# Patient Record
Sex: Female | Born: 1970 | Race: White | Hispanic: No | Marital: Married | Smoking: Never smoker
Health system: Southern US, Community
[De-identification: ages and names within clinical notes are randomized; demographics above are authoritative.]

## PROBLEM LIST (undated history)

## (undated) DIAGNOSIS — E7800 Pure hypercholesterolemia, unspecified: Secondary | ICD-10-CM

## (undated) DIAGNOSIS — I8390 Asymptomatic varicose veins of unspecified lower extremity: Secondary | ICD-10-CM

## (undated) DIAGNOSIS — K5900 Constipation, unspecified: Secondary | ICD-10-CM

## (undated) DIAGNOSIS — M549 Dorsalgia, unspecified: Secondary | ICD-10-CM

## (undated) DIAGNOSIS — M7989 Other specified soft tissue disorders: Secondary | ICD-10-CM

## (undated) DIAGNOSIS — E538 Deficiency of other specified B group vitamins: Secondary | ICD-10-CM

## (undated) DIAGNOSIS — M2550 Pain in unspecified joint: Secondary | ICD-10-CM

## (undated) DIAGNOSIS — F419 Anxiety disorder, unspecified: Secondary | ICD-10-CM

## (undated) DIAGNOSIS — E559 Vitamin D deficiency, unspecified: Secondary | ICD-10-CM

## (undated) HISTORY — DX: Other specified soft tissue disorders: M79.89

## (undated) HISTORY — DX: Pain in unspecified joint: M25.50

## (undated) HISTORY — DX: Pure hypercholesterolemia, unspecified: E78.00

## (undated) HISTORY — DX: Constipation, unspecified: K59.00

## (undated) HISTORY — DX: Asymptomatic varicose veins of unspecified lower extremity: I83.90

## (undated) HISTORY — DX: Vitamin D deficiency, unspecified: E55.9

## (undated) HISTORY — PX: CYSTOSCOPY: SUR368

## (undated) HISTORY — PX: ABLATION: SHX5711

## (undated) HISTORY — DX: Deficiency of other specified B group vitamins: E53.8

## (undated) HISTORY — PX: OTHER SURGICAL HISTORY: SHX169

## (undated) HISTORY — DX: Dorsalgia, unspecified: M54.9

## (undated) HISTORY — DX: Anxiety disorder, unspecified: F41.9

---

## 1998-01-28 ENCOUNTER — Inpatient Hospital Stay (HOSPITAL_COMMUNITY): Admission: AD | Admit: 1998-01-28 | Discharge: 1998-01-30 | Payer: Self-pay | Admitting: Obstetrics & Gynecology

## 1999-04-03 ENCOUNTER — Other Ambulatory Visit: Admission: RE | Admit: 1999-04-03 | Discharge: 1999-04-03 | Payer: Self-pay | Admitting: Obstetrics and Gynecology

## 1999-10-09 ENCOUNTER — Inpatient Hospital Stay (HOSPITAL_COMMUNITY): Admission: AD | Admit: 1999-10-09 | Discharge: 1999-10-11 | Payer: Self-pay | Admitting: Obstetrics and Gynecology

## 1999-11-09 ENCOUNTER — Other Ambulatory Visit: Admission: RE | Admit: 1999-11-09 | Discharge: 1999-11-09 | Payer: Self-pay | Admitting: Obstetrics and Gynecology

## 2001-02-13 ENCOUNTER — Observation Stay (HOSPITAL_COMMUNITY): Admission: AD | Admit: 2001-02-13 | Discharge: 2001-02-14 | Payer: Self-pay | Admitting: Obstetrics and Gynecology

## 2001-02-13 ENCOUNTER — Encounter: Payer: Self-pay | Admitting: Obstetrics and Gynecology

## 2001-12-22 ENCOUNTER — Other Ambulatory Visit: Admission: RE | Admit: 2001-12-22 | Discharge: 2001-12-22 | Payer: Self-pay | Admitting: Obstetrics and Gynecology

## 2003-01-21 ENCOUNTER — Other Ambulatory Visit: Admission: RE | Admit: 2003-01-21 | Discharge: 2003-01-21 | Payer: Self-pay | Admitting: Obstetrics and Gynecology

## 2003-08-29 ENCOUNTER — Other Ambulatory Visit: Admission: RE | Admit: 2003-08-29 | Discharge: 2003-08-29 | Payer: Self-pay | Admitting: Obstetrics and Gynecology

## 2004-03-09 ENCOUNTER — Inpatient Hospital Stay (HOSPITAL_COMMUNITY): Admission: AD | Admit: 2004-03-09 | Discharge: 2004-03-12 | Payer: Self-pay | Admitting: Obstetrics and Gynecology

## 2004-03-14 ENCOUNTER — Ambulatory Visit (HOSPITAL_COMMUNITY): Admission: RE | Admit: 2004-03-14 | Discharge: 2004-03-14 | Payer: Self-pay | Admitting: Obstetrics and Gynecology

## 2004-04-18 ENCOUNTER — Other Ambulatory Visit: Admission: RE | Admit: 2004-04-18 | Discharge: 2004-04-18 | Payer: Self-pay | Admitting: Obstetrics and Gynecology

## 2005-09-10 ENCOUNTER — Other Ambulatory Visit: Admission: RE | Admit: 2005-09-10 | Discharge: 2005-09-10 | Payer: Self-pay | Admitting: Obstetrics and Gynecology

## 2006-03-20 ENCOUNTER — Inpatient Hospital Stay (HOSPITAL_COMMUNITY): Admission: AD | Admit: 2006-03-20 | Discharge: 2006-03-22 | Payer: Self-pay | Admitting: Obstetrics and Gynecology

## 2007-09-03 ENCOUNTER — Inpatient Hospital Stay (HOSPITAL_COMMUNITY): Admission: AD | Admit: 2007-09-03 | Discharge: 2007-09-03 | Payer: Self-pay | Admitting: Obstetrics and Gynecology

## 2008-11-26 ENCOUNTER — Emergency Department (HOSPITAL_COMMUNITY): Admission: EM | Admit: 2008-11-26 | Discharge: 2008-11-26 | Payer: Self-pay | Admitting: Emergency Medicine

## 2009-07-10 ENCOUNTER — Encounter: Admission: RE | Admit: 2009-07-10 | Discharge: 2009-07-10 | Payer: Self-pay | Admitting: Obstetrics and Gynecology

## 2010-01-01 ENCOUNTER — Emergency Department (HOSPITAL_COMMUNITY): Admission: EM | Admit: 2010-01-01 | Discharge: 2010-01-01 | Payer: Self-pay | Admitting: Emergency Medicine

## 2010-10-15 ENCOUNTER — Ambulatory Visit (HOSPITAL_COMMUNITY): Admission: RE | Admit: 2010-10-15 | Discharge: 2010-10-15 | Payer: Self-pay | Admitting: Obstetrics and Gynecology

## 2010-12-24 ENCOUNTER — Encounter: Payer: Self-pay | Admitting: Obstetrics and Gynecology

## 2011-02-12 LAB — CBC
HCT: 34.5 % — ABNORMAL LOW (ref 36.0–46.0)
HCT: 39.3 % (ref 36.0–46.0)
Hemoglobin: 11.8 g/dL — ABNORMAL LOW (ref 12.0–15.0)
Hemoglobin: 13.3 g/dL (ref 12.0–15.0)
MCH: 30.9 pg (ref 26.0–34.0)
MCH: 31.1 pg (ref 26.0–34.0)
MCHC: 33.7 g/dL (ref 30.0–36.0)
MCHC: 34 g/dL (ref 30.0–36.0)
MCV: 91.4 fL (ref 78.0–100.0)
MCV: 91.7 fL (ref 78.0–100.0)
Platelets: 221 10*3/uL (ref 150–400)
Platelets: 260 10*3/uL (ref 150–400)
RBC: 3.78 MIL/uL — ABNORMAL LOW (ref 3.87–5.11)
RBC: 4.29 MIL/uL (ref 3.87–5.11)
RDW: 12.2 % (ref 11.5–15.5)
RDW: 12.7 % (ref 11.5–15.5)
WBC: 7.5 10*3/uL (ref 4.0–10.5)
WBC: 8.5 10*3/uL (ref 4.0–10.5)

## 2011-02-18 LAB — PREGNANCY, URINE: Preg Test, Ur: NEGATIVE

## 2011-02-18 LAB — GC/CHLAMYDIA PROBE AMP, GENITAL
Chlamydia, DNA Probe: NEGATIVE
GC Probe Amp, Genital: NEGATIVE

## 2011-02-18 LAB — URINALYSIS, ROUTINE W REFLEX MICROSCOPIC
Ketones, ur: NEGATIVE mg/dL
Nitrite: NEGATIVE
Specific Gravity, Urine: 1.015 (ref 1.005–1.030)
pH: 5.5 (ref 5.0–8.0)

## 2011-02-18 LAB — WET PREP, GENITAL

## 2011-04-19 NOTE — H&P (Signed)
[facility]  Patient:    ALYSSANDRA, HULSEBUS                        MRN: [id number] Adm. Date:  02/13/01 Attending:  Trevor Iha, M.D.                         History and Physical  HISTORY OF PRESENT ILLNESS:   Ms. Sebastiano is a 40 year old G5, P4, at approximately 5-6 weeks estimated gestational age by somewhat irregular last menstrual period of January 06, 2001.  She began with abdominal pain this morning, went to her internist, who called and stated she needs to be evaluated for ectopic pregnancy.  She presented to the emergency room in obvious distress with rebound and guarding.  Hemoglobin was 11.6.  Beta hCG 3352 and her ultrasound consistent with a ruptured ectopic pregnancy.  PAST MEDICAL HISTORY:         Negative.  PAST SURGICAL HISTORY:        Negative.  PAST OB HISTORY:              Four vaginal deliveries.  GYN HISTORY:                  No history of endometriosis or GYN surgeries.  PHYSICAL EXAMINATION:  VITAL SIGNS:                  Blood pressure is 126/51, pulse is 89, respirations 18.  HEART:                        Regular rate and rhythm.  LUNGS:                        Clear to auscultation bilaterally.  ABDOMEN:                      Mildly distended, diffuse abdominal tenderness to palpation, rebound.  Ultrasound shows hemoperitoneum and no intrauterine pregnancy.  IMPRESSION/PLAN:              Probable ectopic and hemoperitoneum.  Plan laparoscopy for evaluation and treatment of the ectopic pregnancy.  Risks and benefits were discussed at length including, but not limited to, risk of infection, bleeding, and damage to _________ of bowel or bladder, possibility of removing the right tube or ovary.  Also possibility of laparotomy, also risks associated with blood transfusion.  Patient does desire to preserve fertility. DD:  02/13/01 TD:  02/13/01 Job: [id number]

## 2011-09-12 LAB — CBC
HCT: 37.7
Hemoglobin: 12.8
RDW: 14.3 — ABNORMAL HIGH

## 2011-09-19 ENCOUNTER — Other Ambulatory Visit: Payer: Self-pay

## 2011-09-19 DIAGNOSIS — I83893 Varicose veins of bilateral lower extremities with other complications: Secondary | ICD-10-CM

## 2011-11-08 ENCOUNTER — Encounter: Payer: Self-pay | Admitting: Vascular Surgery

## 2011-11-11 ENCOUNTER — Ambulatory Visit (INDEPENDENT_AMBULATORY_CARE_PROVIDER_SITE_OTHER): Payer: BC Managed Care – PPO | Admitting: Vascular Surgery

## 2011-11-11 ENCOUNTER — Encounter: Payer: Self-pay | Admitting: Vascular Surgery

## 2011-11-11 VITALS — BP 118/63 | HR 79 | Resp 20 | Ht 65.0 in | Wt 180.0 lb

## 2011-11-11 DIAGNOSIS — I8310 Varicose veins of unspecified lower extremity with inflammation: Secondary | ICD-10-CM | POA: Insufficient documentation

## 2011-11-11 DIAGNOSIS — I83893 Varicose veins of bilateral lower extremities with other complications: Secondary | ICD-10-CM

## 2011-11-11 NOTE — Progress Notes (Signed)
Subjective:     Patient ID: Ashlee Robertson, female   DOB: [birth year], 40 y.o.   MRN: [id number]  HPI this 40 year old healthy female developed varicose veins in the right leg in 1999 following a pregnancy. Since that time they have enlarged and become increasingly symptomatic. She describes aching throbbing burning and stinging discomfort in the right thigh and calf as the day progresses this worsens. She does not wear elastic compression stockings. Elevation of the leg does improve her symptoms. She has no history of DVT, thrombophlebitis, stasis ulcers, bleeding varicosities, or skin changes in the ankle. The pattern of varicosities has progressed over the past several years.  History reviewed. No pertinent past medical history.  History  Substance Use Topics  . Smoking status: Never Smoker   . Smokeless tobacco: Not on file  . Alcohol Use: No    History reviewed. No pertinent family history.  No Known Allergies  No current outpatient prescriptions on file.  BP 118/63  Pulse 79  Resp 20  Ht 5\' 5"  (1.651 m)  Wt 180 lb (81.647 kg)  BMI 29.95 kg/m2  Body mass index is 29.95 kg/(m^2).         Review of Systems she denies chest pain, dyspnea on exertion, PND, orthopnea, or claudication, and all other symptoms. Complete review of systems is negative     Objective:   Physical Exam blood pressure 118/63 heart rate 79 respirations 20 Generally she is well-developed well-nourished female in no apparent stress alert and oriented x3 HEENT normal for age Chest no rhonchi or wheezing Cardiovascular regular rhythm no murmurs carotid pulses 3+ no audible bruits Abdomen soft nontender with no masses Neurologic exam normal Musculoskeletal free major deformities Lower extremity exam reveals 3+ femoral and dorsalis pedis pulses palpable bilaterally. Right leg has venous insufficiency with bulging varicosities beginning in the proximal thigh extending up toward the inguinal area and and  laterally down around the knee into the lateral calf where there is a large pattern of reticular and spider veins. There is no hyperpigmentation or ulceration noted. No edema is noted today.      Assessment:     Venous insufficiency right leg with bulging varicosities in the right lateral thigh large area of reticular and spider veins which are increasingly symptomatic.    Plan:    #1 long-leg elastic compression stockings 20-30 mm gradient #2 elevate legs during the day as much as possible #3 ibuprofen on a daily basis #4 return in 3 months a venous duplex exam to see if she is a candidate for laser ablation and/or stab phlebectomy and sclerotherapy depending on outcome of ultrasound.

## 2011-11-13 ENCOUNTER — Encounter: Payer: Self-pay | Admitting: Vascular Surgery

## 2011-11-13 ENCOUNTER — Other Ambulatory Visit: Payer: Self-pay

## 2012-02-10 ENCOUNTER — Encounter: Payer: Self-pay | Admitting: Vascular Surgery

## 2012-02-11 ENCOUNTER — Encounter: Payer: Self-pay | Admitting: Vascular Surgery

## 2012-02-11 ENCOUNTER — Encounter (INDEPENDENT_AMBULATORY_CARE_PROVIDER_SITE_OTHER): Payer: BC Managed Care – PPO | Admitting: *Deleted

## 2012-02-11 ENCOUNTER — Ambulatory Visit (INDEPENDENT_AMBULATORY_CARE_PROVIDER_SITE_OTHER): Payer: BC Managed Care – PPO | Admitting: Vascular Surgery

## 2012-02-11 VITALS — BP 100/59 | HR 73 | Resp 18 | Ht 65.0 in | Wt 175.0 lb

## 2012-02-11 DIAGNOSIS — I83893 Varicose veins of bilateral lower extremities with other complications: Secondary | ICD-10-CM

## 2012-02-11 NOTE — Progress Notes (Signed)
Subjective:     Patient ID: Ashlee Robertson, female   DOB: 12-Mar-1971, 41 y.o.   MRN: [id number]  HPI this 41 year old female turned today for further followup regarding her severe venous insufficiency of the right leg. She continues to have aching throbbing and burning discomfort in the upper lateral right thigh extending down lateral to the knee into the calf this has not responded or improved with long leg elastic compression stockings 20-30 mm gradient as well as elevation and ibuprofen on a daily basis. She continues to have worsening symptoms which are affecting her daily living. She returns today for continued followup.  Past Medical History  Diagnosis Date  . Varicose veins     History  Substance Use Topics  . Smoking status: Never Smoker   . Smokeless tobacco: Never Used  . Alcohol Use: No    No family history on file.  No Known Allergies  No current outpatient prescriptions on file.  BP 100/59  Pulse 73  Resp 18  Ht 5\' 5"  (1.651 m)  Wt 175 lb (79.379 kg)  BMI 29.12 kg/m2  Body mass index is 29.12 kg/(m^2).            Review of Systems     Objective:   Physical Exam pressure 100/60 heart rate 73 respirations 18 Right lower stream he continues to have bulging varicosities beginning at the inguinal crease extending laterally in the thigh down around the knee and into the lateral calf area with reticular and spider veins as well as the bulging varicosities. There is no edema distally.  Today I ordered a venous duplex exam of the right leg which are reviewed and interpreted. The large strand of bulging varicosities communicate lateral to the saphenofemoral junction with a likely totally pain is entire area reveals reflux. The great saphenous vein itself is not have reflux. Reflux coming from this lateral branch communicates with all of the named varicosities above. There is no DVT.    Assessment:     Venous insufficiency right leg with symptomatic bulging  varicosities secondary to valvular incompetence. Symptoms are quite severe for this middle-aged female and there definitely affecting her daily living    Plan:      Believe the best plan would be greater than 20 stab phlebectomy of bulging varicosities right thigh and lateral calf area with 2 courses of sclerotherapy  We'll proceed with precertification to perform this in the near future to relieve her symptoms

## 2012-02-17 NOTE — Procedures (Unsigned)
LOWER EXTREMITY VENOUS REFLUX EXAM  INDICATION:  Painful varicosities of right lower extremity.  EXAM:  Using color-flow imaging and pulse Doppler spectral analysis, the right common femoral, femoral, popliteal, posterior tibial, great and small saphenous veins were evaluated.  There is no evidence suggesting deep venous insufficiency in the right lower extremity.  The right saphenofemoral junction is not competent with Reflux of >55milliseconds. The right great saphenous vein is competent.  The right proximal small saphenous vein demonstrates competency.  GSV Diameter (used if found to be incompetent only)                                           Right    Left Proximal Greater Saphenous Vein           cm       cm Proximal-to-mid-thigh                     cm       cm Mid thigh                                 cm       cm Mid-distal thigh                          cm       cm Distal thigh                              cm       cm Knee                                      cm       cm  IMPRESSION: 1. Right great saphenous is competent. 2. The right great saphenous vein does not appear to communicate with     the anterior thigh varicosities. 3. The deep venous system is competent. 4. The right small saphenous vein is competent,  ___________________________________________ Quita Skye. Hart Rochester, M.D.  LT/MEDQ  D:  02/11/2012  T:  02/11/2012  Job:  [id number]

## 2012-03-20 ENCOUNTER — Encounter: Payer: Self-pay | Admitting: Vascular Surgery

## 2012-03-23 ENCOUNTER — Ambulatory Visit (INDEPENDENT_AMBULATORY_CARE_PROVIDER_SITE_OTHER): Payer: BC Managed Care – PPO | Admitting: Vascular Surgery

## 2012-03-23 ENCOUNTER — Encounter: Payer: BC Managed Care – PPO | Admitting: Vascular Surgery

## 2012-03-23 ENCOUNTER — Encounter: Payer: Self-pay | Admitting: Vascular Surgery

## 2012-03-23 VITALS — BP 137/83 | HR 83 | Resp 16 | Ht 65.0 in | Wt 180.0 lb

## 2012-03-23 DIAGNOSIS — I83893 Varicose veins of bilateral lower extremities with other complications: Secondary | ICD-10-CM

## 2012-03-23 NOTE — Progress Notes (Signed)
Laser Ablation Procedure      Date: 03/23/2012    Ashlee Robertson DOB:06-08-1971  Consent signed: Yes  Surgeon:J.D. Hart Rochester  Procedure: Stab Phlebectomies right leg  BP 137/83  Pulse 83  Resp 16  Ht 5\' 5"  (1.651 m)  Wt 180 lb (81.647 kg)  BMI 29.95 kg/m2  Start time: 1:15   End time: 2:20  Tumescent Anesthesia: 200 cc 0.9% NaCl with 50 cc Lidocaine HCL with 1% Epi and 15 cc 8.4% NaHCO3  Local Anesthesia: 8 cc Lidocaine HCL and NaHCO3 (ratio 2:1)       Stab Phlebectomy: greater than 20 Sites: Thigh and Calf  Patient tolerated procedure well: [name]  Description of Procedure:    The patient was then put into Trendelenburg position.  Local anesthetic was utilized overlying the marked varicosities.  Greater than 20 stab wounds were made using the tip of an 11 blade; and using the vein hook,  The phlebectomies were performed using a hemostat to avulse these varicosities.  Adequate hemostasis was achieved, and steri strips were applied to the stab wound.     ABD pads and thigh high compression stockings were applied.  Ace wrap bandages were applied over the phlebectomy sites.  Blood loss was less than 15 cc.  The patient ambulated out of the operating room having tolerated the procedure well.

## 2012-03-23 NOTE — Progress Notes (Signed)
Subjective:     Patient ID: Ashlee Robertson, female   DOB: Dec 11, 1970, 41 y.o.   MRN: [id number]  HPI this 41 year old female had greater than 20 stab lobectomies of the right anterior and lateral thigh lateral calf area her painful varicosities. This was performed under local tumescent anesthesia. She tolerated the procedure well.   Review of Systems     Objective:   Physical ExamBP 137/83  Pulse 83  Resp 16  Ht 5\' 5"  (1.651 m)  Wt 180 lb (81.647 kg)  BMI 29.95 kg/m2     Assessment:     Well tolerated stab phlebectomy-greater than 20 secondary varicosities right anterolateral thigh and calf performed under local tumescent anesthesia    Plan:     Return in 6 weeks for 2 courses of sclerotherapy right leg

## 2012-03-24 ENCOUNTER — Encounter: Payer: Self-pay | Admitting: Vascular Surgery

## 2012-03-24 ENCOUNTER — Telehealth: Payer: Self-pay | Admitting: *Deleted

## 2012-03-24 NOTE — Telephone Encounter (Signed)
Reached patient at home. Doing well. No bleeding. Following all instructions. Reminded her of her fu appts.

## 2012-04-20 ENCOUNTER — Encounter: Payer: Self-pay | Admitting: *Deleted

## 2012-04-21 ENCOUNTER — Telehealth: Payer: Self-pay | Admitting: *Deleted

## 2012-04-21 ENCOUNTER — Ambulatory Visit (INDEPENDENT_AMBULATORY_CARE_PROVIDER_SITE_OTHER): Payer: BC Managed Care – PPO | Admitting: *Deleted

## 2012-04-21 ENCOUNTER — Encounter: Payer: Self-pay | Admitting: *Deleted

## 2012-04-21 DIAGNOSIS — I8310 Varicose veins of unspecified lower extremity with inflammation: Secondary | ICD-10-CM

## 2012-04-21 DIAGNOSIS — I781 Nevus, non-neoplastic: Secondary | ICD-10-CM | POA: Insufficient documentation

## 2012-04-21 NOTE — Telephone Encounter (Signed)
I had called the patient to check on her. This was her return phone call. She sounded good and said she was fine. I will let Dr. Hart Rochester know.

## 2012-04-21 NOTE — Progress Notes (Signed)
X=.3% Sotradecol administered with a 27g butterfly.  Patient received a total of 6cc. Treated all areas of concern. Easy access. Patient felt fine until she walked out to the lobby area. Said she felt a tightness when she took a deep breath and felt like she needed to cough. We observed her for 60 minutes. Vital signs: 137/87. HR: 87, resp: 20. Oxygen sat: 100%. Dr. Hart Rochester performed a neuro exam which was normal. Listened to her chest and heard no rales or ronchi. Pt. Coughed up clear phlem, no blood. Thirty minutes into the 60 she said she felt a little light headed. We continued to observe her and had her walk around a little in the room. Her husband arrived and will drive her home today. At the end of the hour, she stated she was better. We instructed her to take a Benadryl when she got home and if she worsened to call her medical doctor. If he was unable to see her, then she should go to the ERD. I asked that the patient call me tomorrow to let me know how she was doing. Patient and her husband were calm and appreciative for our care. Follow prn.  Photos: yes  Compression stockings applied: yes

## 2015-07-02 ENCOUNTER — Emergency Department (HOSPITAL_COMMUNITY): Payer: BLUE CROSS/BLUE SHIELD

## 2015-07-02 ENCOUNTER — Encounter (HOSPITAL_COMMUNITY): Payer: Self-pay

## 2015-07-02 ENCOUNTER — Emergency Department (HOSPITAL_COMMUNITY)
Admission: EM | Admit: 2015-07-02 | Discharge: 2015-07-02 | Disposition: A | Discharge status: Home / Self Care | Payer: BLUE CROSS/BLUE SHIELD | Attending: Emergency Medicine | Admitting: Emergency Medicine

## 2015-07-02 DIAGNOSIS — R079 Chest pain, unspecified: Secondary | ICD-10-CM | POA: Diagnosis not present

## 2015-07-02 DIAGNOSIS — Z8679 Personal history of other diseases of the circulatory system: Secondary | ICD-10-CM | POA: Insufficient documentation

## 2015-07-02 DIAGNOSIS — R42 Dizziness and giddiness: Secondary | ICD-10-CM | POA: Diagnosis not present

## 2015-07-02 DIAGNOSIS — R531 Weakness: Secondary | ICD-10-CM | POA: Diagnosis not present

## 2015-07-02 DIAGNOSIS — R519 Headache, unspecified: Secondary | ICD-10-CM

## 2015-07-02 DIAGNOSIS — M549 Dorsalgia, unspecified: Secondary | ICD-10-CM | POA: Insufficient documentation

## 2015-07-02 DIAGNOSIS — Z79899 Other long term (current) drug therapy: Secondary | ICD-10-CM | POA: Diagnosis not present

## 2015-07-02 DIAGNOSIS — R51 Headache: Secondary | ICD-10-CM | POA: Diagnosis not present

## 2015-07-02 LAB — I-STAT TROPONIN, ED
Troponin i, poc: 0 ng/mL (ref 0.00–0.08)
Troponin i, poc: 0 ng/mL (ref 0.00–0.08)

## 2015-07-02 LAB — CBC
HEMATOCRIT: 41.9 % (ref 36.0–46.0)
HEMOGLOBIN: 14.1 g/dL (ref 12.0–15.0)
MCH: 29.7 pg (ref 26.0–34.0)
MCHC: 33.7 g/dL (ref 30.0–36.0)
MCV: 88.2 fL (ref 78.0–100.0)
Platelets: 292 10*3/uL (ref 150–400)
RBC: 4.75 MIL/uL (ref 3.87–5.11)
RDW: 13.2 % (ref 11.5–15.5)
WBC: 9.7 10*3/uL (ref 4.0–10.5)

## 2015-07-02 LAB — BASIC METABOLIC PANEL
Anion gap: 11 (ref 5–15)
BUN: 11 mg/dL (ref 6–20)
CALCIUM: 9.8 mg/dL (ref 8.9–10.3)
CHLORIDE: 97 mmol/L — AB (ref 101–111)
CO2: 28 mmol/L (ref 22–32)
Creatinine, Ser: 0.88 mg/dL (ref 0.44–1.00)
GLUCOSE: 96 mg/dL (ref 65–99)
POTASSIUM: 3.4 mmol/L — AB (ref 3.5–5.1)
SODIUM: 136 mmol/L (ref 135–145)

## 2015-07-02 LAB — D-DIMER, QUANTITATIVE: D-Dimer, Quant: <0.27 ug/mL-FEU (ref 0.00–0.48)

## 2015-07-02 NOTE — ED Provider Notes (Signed)
CSN: [id number]   Arrival date & time 07/02/15 0024  History  This chart was scribed for  Mirian Mo, MD by Bethel Born, ED Scribe. This patient was seen in room B18C/B18C and the patient's care was started at 1:10 AM.  Chief Complaint  Patient presents with  . Chest Pain    HPI The history is provided by the patient. No language interpreter was used.   Ashlee Robertson is a 44 y.o. female who presents to the Emergency Department complaining of chest pain with sudden onset around 11:30 PM while walking into her house. The episode started with a sudden bilateral headache that radiated down the neck and shoulders. She had 2 episodes of the pain that lasted for a few seconds each. At that time she was light headed and had been in a warehouse for 2 hours. The chest pain has persisted and is described as tight and squeezing at present. She had heartburn last night but this pain is dissimilar. Associated symptoms include anxiety, numbness in the left side of the jaw, left sided back pain, and weakness in the LUE. Pt denies nausea, vomiting, diarrhea, and constipation. No history of anxiety or migraines. No recent increase in stress.   Past Medical History  Diagnosis Date  . Varicose veins     Past Surgical History  Procedure Laterality Date  . Laproscopy      ectopic preg  . Ablation      uterine    No family history on file.  History  Substance Use Topics  . Smoking status: Never Smoker   . Smokeless tobacco: Never Used  . Alcohol Use: No     Review of Systems  Constitutional: Negative for fever.  Respiratory: Negative for apnea and shortness of breath.   Cardiovascular: Positive for chest pain.  Gastrointestinal: Negative for nausea, vomiting and abdominal pain.  Musculoskeletal: Positive for back pain.  Neurological: Positive for weakness, light-headedness and headaches.  All other systems reviewed and are negative.    Home Medications   Prior to Admission  medications   Medication Sig Start Date End Date Taking? Authorizing Provider  Cholecalciferol (VITAMIN D-3 PO) Take 1 tablet by mouth daily.   Yes Historical Provider, MD  Cyanocobalamin (VITAMIN B-12 PO) Take 1 tablet by mouth daily.   Yes Historical Provider, MD    Allergies  Review of patient's allergies indicates no known allergies.  Triage Vitals: BP 138/63 mmHg  Pulse 82  Temp(Src) 97.7 F (36.5 C) (Oral)  Resp 20  Ht  (1.626 m)  Wt 190 lb (86.183 kg)  BMI 32.60 kg/m2  SpO2 99%  Physical Exam  Constitutional: She is oriented to person, place, and time. She appears well-developed and well-nourished.  HENT:  Head: Normocephalic and atraumatic.  Right Ear: External ear normal.  Left Ear: External ear normal.  Eyes: Conjunctivae and EOM are normal. Pupils are equal, round, and reactive to light.  Neck: Normal range of motion. Neck supple.  Cardiovascular: Normal rate, regular rhythm, normal heart sounds and intact distal pulses.   Pulmonary/Chest: Effort normal and breath sounds normal.  Abdominal: Soft. Bowel sounds are normal. There is no tenderness.  Musculoskeletal: Normal range of motion.  Neurological: She is alert and oriented to person, place, and time. She has normal strength and normal reflexes. No cranial nerve deficit or sensory deficit. Coordination normal. GCS eye subscore is 4. GCS verbal subscore is 5. GCS motor subscore is 6.  Skin: Skin is warm and dry.  Vitals reviewed.   ED Course  Procedures   DIAGNOSTIC STUDIES: Oxygen Saturation is 99% on RA, normal by my interpretation.    COORDINATION OF CARE: 1:18 AM Discussed treatment plan which includes CT head without contrast, CXR, EKG, and lab work with pt at bedside and pt agreed to plan.  Labs Review-  Labs Reviewed  BASIC METABOLIC PANEL - Abnormal; Notable for the following:    Potassium 3.4 (*)    Chloride 97 (*)    All other components within normal limits  CBC  D-DIMER, QUANTITATIVE  (NOT AT [facility])  I-STAT TROPOININ, ED  I-STAT TROPOININ, ED  Rosezena Sensor, ED    Imaging Review Dg Chest 2 View  07/02/2015   CLINICAL DATA:  Chest pain neck pain and dyspnea, onset tonight.  EXAM: CHEST  2 VIEW  COMPARISON:  None.  FINDINGS: The heart size and mediastinal contours are within normal limits. Both lungs are clear. The visualized skeletal structures are unremarkable.  IMPRESSION: No active cardiopulmonary disease.   Electronically Signed   By: Ellery Plunk M.D.   On: 07/02/2015 01:12   Ct Head Wo Contrast  07/02/2015   CLINICAL DATA:  Initial evaluation for a acute headache.  EXAM: CT HEAD WITHOUT CONTRAST  TECHNIQUE: Contiguous axial images were obtained from the base of the skull through the vertex without intravenous contrast.  COMPARISON:  Prior study from 11/26/2008  FINDINGS: There is no acute intracranial hemorrhage or infarct. No mass lesion or midline shift. Gray-white matter differentiation is well maintained. Ventricles are normal in size without evidence of hydrocephalus. CSF containing spaces are within normal limits. No extra-axial fluid collection.  The calvarium is intact.  Orbital soft tissues are within normal limits.  Minimal opacity seen layering within the right sphenoid sinus. Visualized paranasal sinuses are otherwise clear. No mastoid effusion.  Scalp soft tissues are unremarkable.  IMPRESSION: Negative head CT with no acute intracranial process identified.   Electronically Signed   By: Rise Mu M.D.   On: 07/02/2015 02:01    EKG Interpretation  Date/Time:  Sunday July 02 2015 00:32:34 EDT Ventricular Rate:  76 PR Interval:  162 QRS Duration: 84 QT Interval:  358 QTC Calculation: 402 R Axis:   65 Text Interpretation:  Normal sinus rhythm Possible Left atrial enlargement Borderline ECG No old tracing to compare Confirmed by Franki Stemen ([id number]) on 07/02/2015 1:05:55 AM       MDM   Final diagnoses:  Acute nonintractable  headache, unspecified headache type  Chest pain, unspecified chest pain type     44  y.o. female without pertinent PMH presents with ha, chest pain as above.  HA began prior to chest pain, however pt states she felt anxious as the precipitant for her chest tightness.  On arrival ha relieved, pt with mild chest discomfort.  No fevers, GI symptoms, respiratory symptoms.  Exam as above, benign.  Wu unremarkable including delta trop and negative head ct.  As ha was within last 6 hours in onset, consider noncontrast head ct sufficient to ro SAH.    Symptoms relieved with HA cocktail.  DC home in stable condition.  I have reviewed all laboratory and imaging studies if ordered as above  1. Acute nonintractable headache, unspecified headache type   2. Chest pain, unspecified chest pain type          Mirian Mo, MD 07/02/15 2336

## 2015-07-02 NOTE — ED Notes (Signed)
Pt states she was walking into the house about 2330 and started having pressure to both sides of her neck and had a sudden onset headache. Came with anxiety type feelings like nervousness and scared. Felt weak all over. No SOB, or nausea or vomiting.

## 2015-07-02 NOTE — Discharge Instructions (Signed)
Chest Pain (Nonspecific) °It is often hard to give a specific diagnosis for the cause of chest pain. There is always a chance that your pain could be related to something serious, such as a heart attack or a blood clot in the lungs. You need to follow up with your health care provider for further evaluation. °CAUSES  °· Heartburn. °· Pneumonia or bronchitis. °· Anxiety or stress. °· Inflammation around your heart (pericarditis) or lung (pleuritis or pleurisy). °· A blood clot in the lung. °· A collapsed lung (pneumothorax). It can develop suddenly on its own (spontaneous pneumothorax) or from trauma to the chest. °· Shingles infection (herpes zoster virus). °The chest wall is composed of bones, muscles, and cartilage. Any of these can be the source of the pain. °· The bones can be bruised by injury. °· The muscles or cartilage can be strained by coughing or overwork. °· The cartilage can be affected by inflammation and become sore (costochondritis). °DIAGNOSIS  °Lab tests or other studies may be needed to find the cause of your pain. Your health care provider may have you take a test called an ambulatory electrocardiogram (ECG). An ECG records your heartbeat patterns over a 24-hour period. You may also have other tests, such as: °· Transthoracic echocardiogram (TTE). During echocardiography, sound waves are used to evaluate how blood flows through your heart. °· Transesophageal echocardiogram (TEE). °· Cardiac monitoring. This allows your health care provider to monitor your heart rate and rhythm in real time. °· Holter monitor. This is a portable device that records your heartbeat and can help diagnose heart arrhythmias. It allows your health care provider to track your heart activity for several days, if needed. °· Stress tests by exercise or by giving medicine that makes the heart beat faster. °TREATMENT  °· Treatment depends on what may be causing your chest pain. Treatment may include: °¨ Acid blockers for  heartburn. °¨ Anti-inflammatory medicine. °¨ Pain medicine for inflammatory conditions. °¨ Antibiotics if an infection is present. °· You may be advised to change lifestyle habits. This includes stopping smoking and avoiding alcohol, caffeine, and chocolate. °· You may be advised to keep your head raised (elevated) when sleeping. This reduces the chance of acid going backward from your stomach into your esophagus. °Most of the time, nonspecific chest pain will improve within 2-3 days with rest and mild pain medicine.  °HOME CARE INSTRUCTIONS  °· If antibiotics were prescribed, take them as directed. Finish them even if you start to feel better. °· For the next few days, avoid physical activities that bring on chest pain. Continue physical activities as directed. °· Do not use any tobacco products, including cigarettes, chewing tobacco, or electronic cigarettes. °· Avoid drinking alcohol. °· Only take medicine as directed by your health care provider. °· Follow your health care provider's suggestions for further testing if your chest pain does not go away. °· Keep any follow-up appointments you made. If you do not go to an appointment, you could develop lasting (chronic) problems with pain. If there is any problem keeping an appointment, call to reschedule. °SEEK MEDICAL CARE IF:  °· Your chest pain does not go away, even after treatment. °· You have a rash with blisters on your chest. °· You have a fever. °SEEK IMMEDIATE MEDICAL CARE IF:  °· You have increased chest pain or pain that spreads to your arm, neck, jaw, back, or abdomen. °· You have shortness of breath. °· You have an increasing cough, or you cough   up blood. °· You have severe back or abdominal pain. °· You feel nauseous or vomit. °· You have severe weakness. °· You faint. °· You have chills. °This is an emergency. Do not wait to see if the pain will go away. Get medical help at once. Call your local emergency services (911 in [place] Do not drive  yourself to the hospital. °MAKE SURE YOU:  °· Understand these instructions. °· Will watch your condition. °· Will get help right away if you are not doing well or get worse. °Document Released: 08/28/2005 Document Revised: 11/23/2013 Document Reviewed: 06/23/2008 °ExitCare® Patient Information ©2015 [employer]. This information is not intended to replace advice given to you by your health care provider. Make sure you discuss any questions you have with your health care provider. °General Headache Without Cause °A headache is pain or discomfort felt around the head or neck area. The specific cause of a headache may not be found. There are many causes and types of headaches. A few common ones are: °· Tension headaches. °· Migraine headaches. °· Cluster headaches. °· Chronic daily headaches. °HOME CARE INSTRUCTIONS  °· Keep all follow-up appointments with your caregiver or any specialist referral. °· Only take over-the-counter or prescription medicines for pain or discomfort as directed by your caregiver. °· Lie down in a dark, quiet room when you have a headache. °· Keep a headache journal to find out what may trigger your migraine headaches. For example, write down: °¨ What you eat and drink. °¨ How much sleep you get. °¨ Any change to your diet or medicines. °· Try massage or other relaxation techniques. °· Put ice packs or heat on the head and neck. Use these 3 to 4 times per day for 15 to 20 minutes each time, or as needed. °· Limit stress. °· Sit up straight, and do not tense your muscles. °· Quit smoking if you smoke. °· Limit alcohol use. °· Decrease the amount of caffeine you drink, or stop drinking caffeine. °· Eat and sleep on a regular schedule. °· Get 7 to 9 hours of sleep, or as recommended by your caregiver. °· Keep lights dim if bright lights bother you and make your headaches worse. °SEEK MEDICAL CARE IF:  °· You have problems with the medicines you were prescribed. °· Your medicines are not  working. °· You have a change from the usual headache. °· You have nausea or vomiting. °SEEK IMMEDIATE MEDICAL CARE IF:  °· Your headache becomes severe. °· You have a fever. °· You have a stiff neck. °· You have loss of vision. °· You have muscular weakness or loss of muscle control. °· You start losing your balance or have trouble walking. °· You feel faint or pass out. °· You have severe symptoms that are different from your first symptoms. °MAKE SURE YOU:  °· Understand these instructions. °· Will watch your condition. °· Will get help right away if you are not doing well or get worse. °Document Released: 11/18/2005 Document Revised: 02/10/2012 Document Reviewed: 12/04/2011 °Exit[employer]® Patient Information ©2015 [employer]. This information is not intended to replace advice given to you by your health care provider. Make sure you discuss any questions you have with your health care provider. ° °

## 2017-03-03 IMAGING — CT CT HEAD W/O CM
1 series · 16 of 30 positions shown, 20 images · non-contrast
Comparison: Prior study from 11/26/2008

CLINICAL DATA: Initial evaluation for a acute headache.

EXAM:
CT HEAD WITHOUT CONTRAST
TECHNIQUE: Contiguous axial images were obtained from the base of the skull
through the vertex without intravenous contrast.

[Series 2: head 5.0 h30s · axial · 0.41mm/px · z∈[-210,-75]mm · 16 of 30 slices shown, 20 images]
[im 2/30  brain]
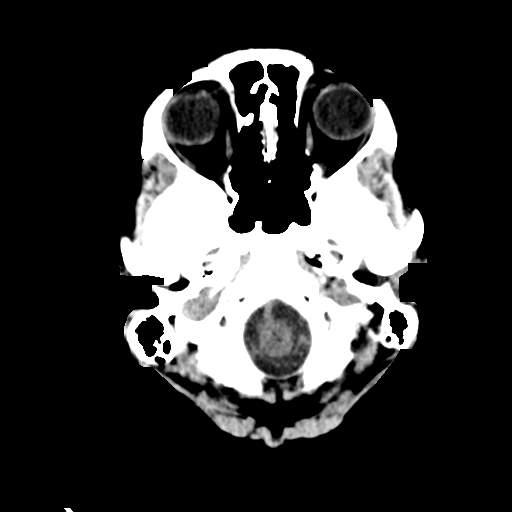
[im 2/30  bone]
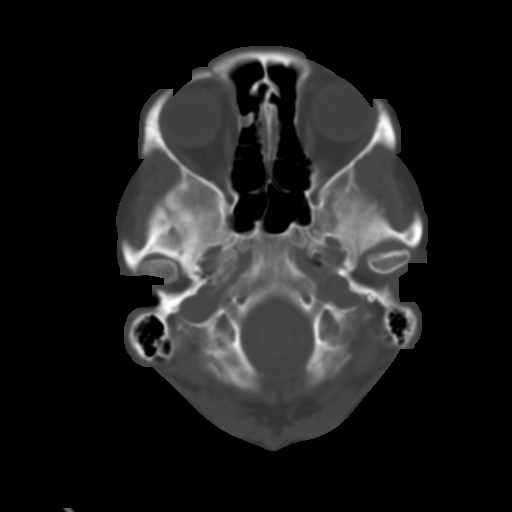
[im 4/30  brain]
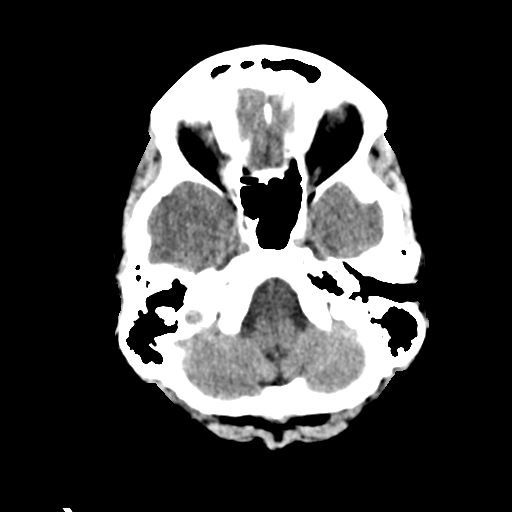
[im 6/30  brain]
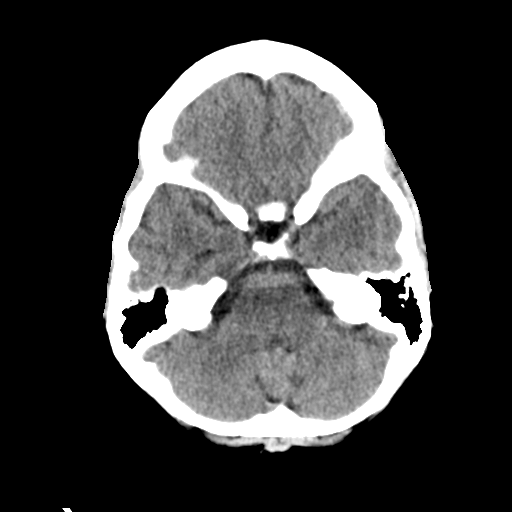
[im 8/30  brain]
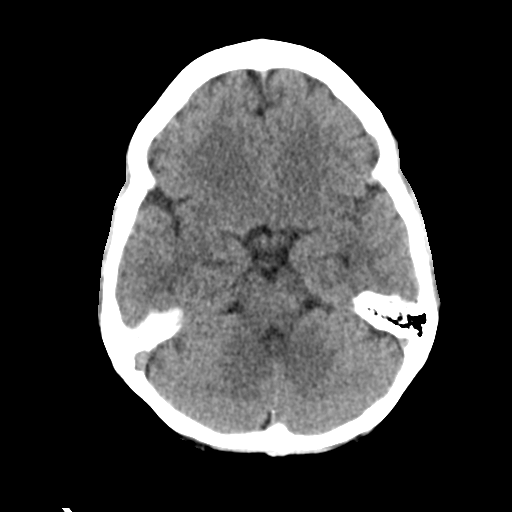
[im 9/30  brain]
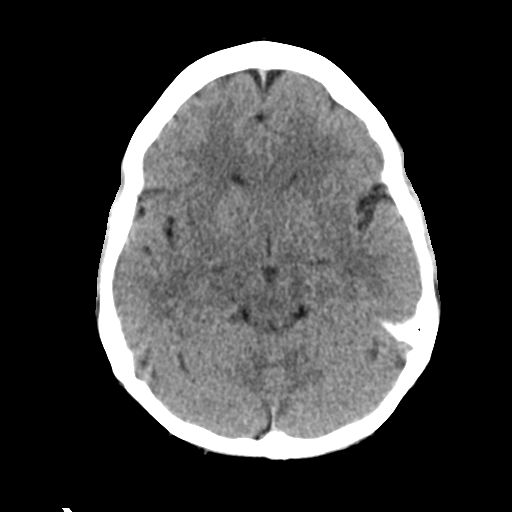
[im 9/30  bone]
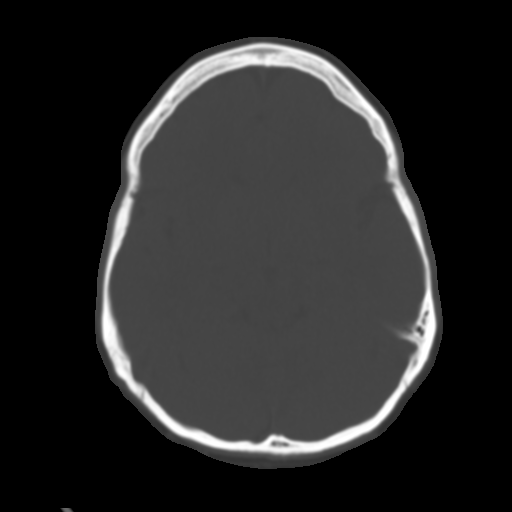
[im 11/30  brain]
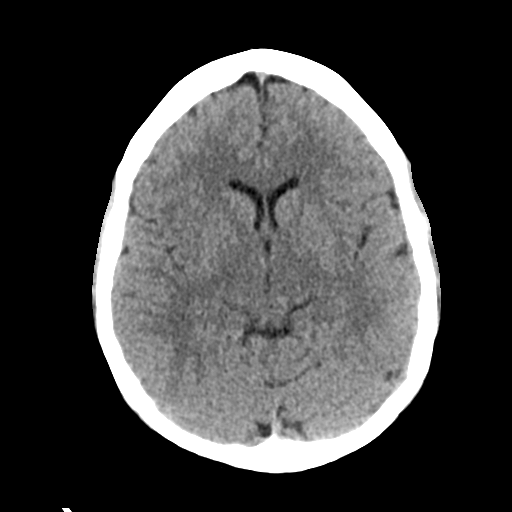
[im 13/30  brain]
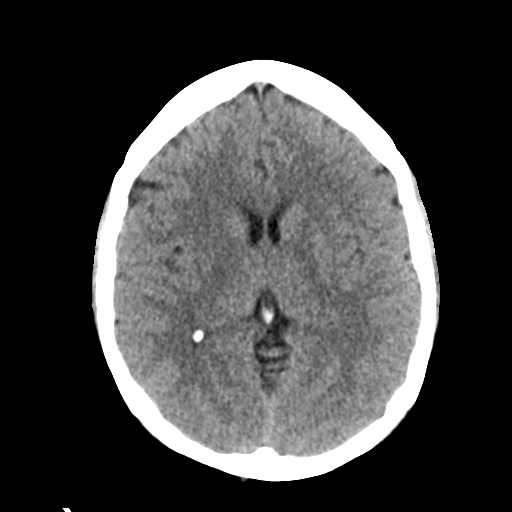
[im 15/30  brain]
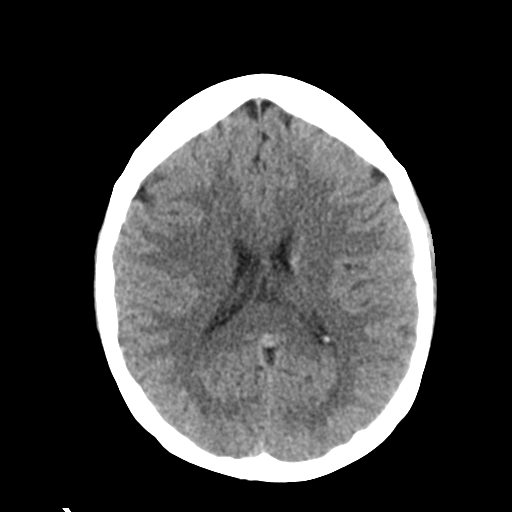
[im 16/30  brain]
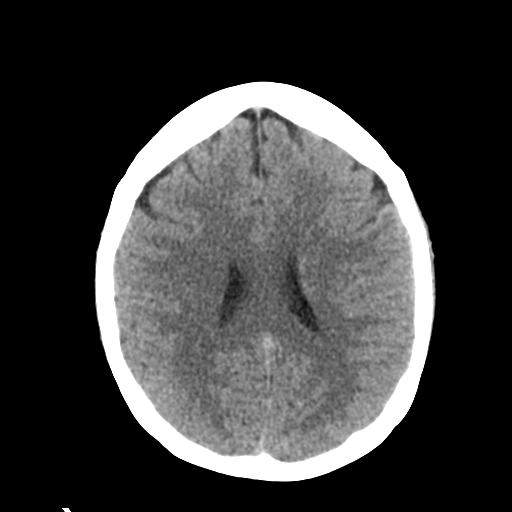
[im 16/30  bone]
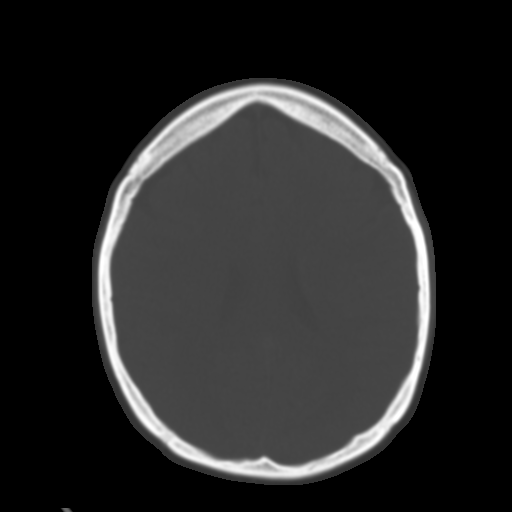
[im 18/30  brain]
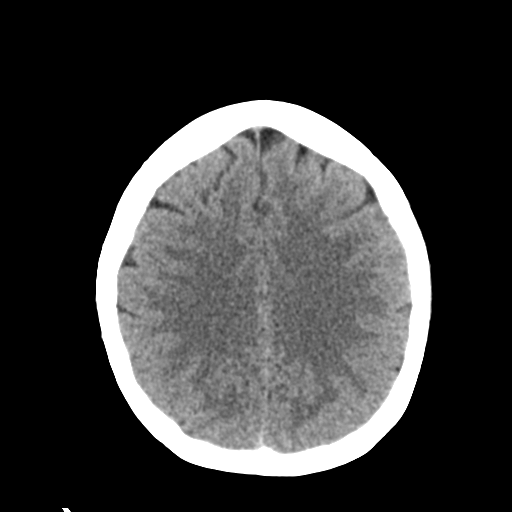
[im 20/30  brain]
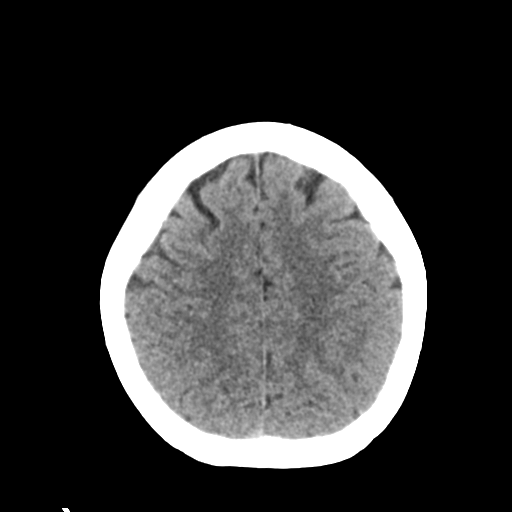
[im 22/30  brain]
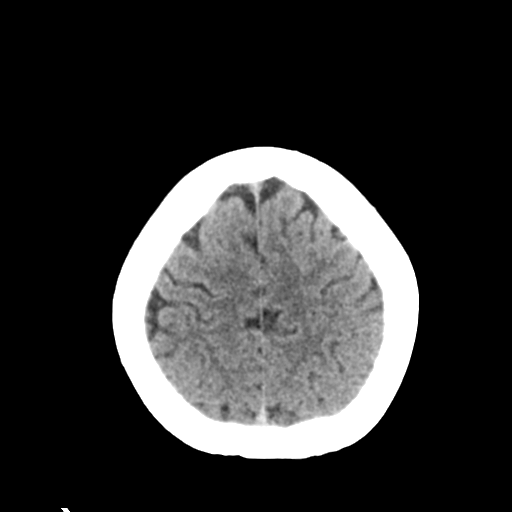
[im 23/30  brain]
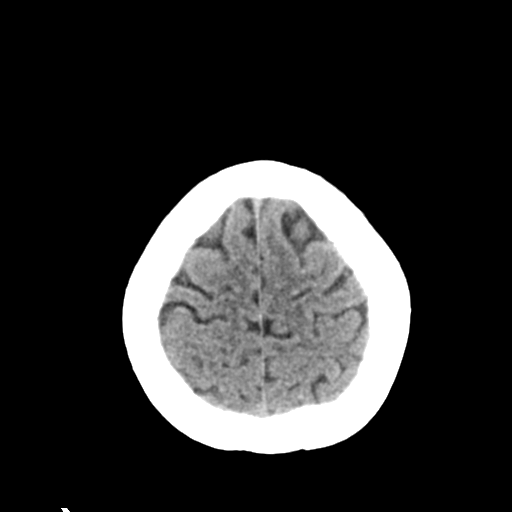
[im 23/30  bone]
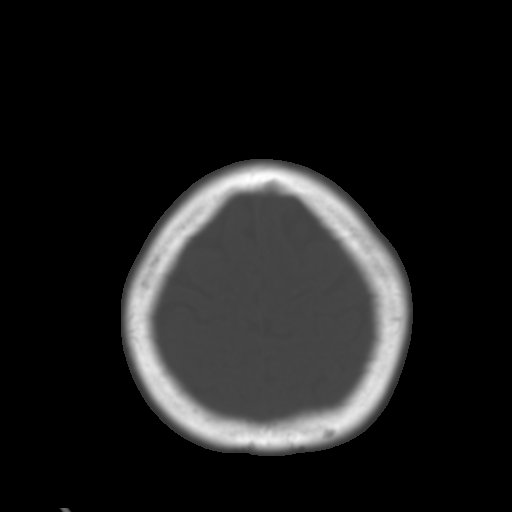
[im 25/30  brain]
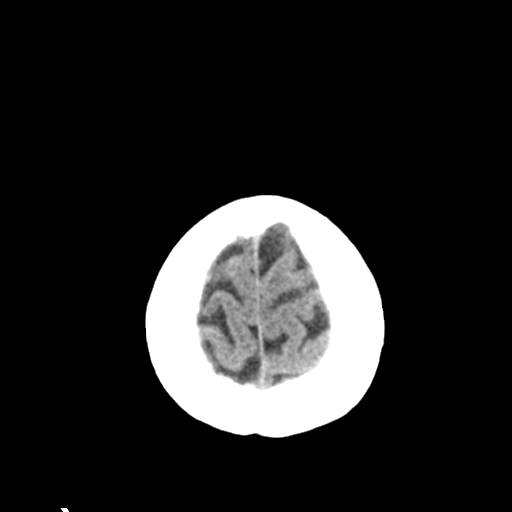
[im 27/30  brain]
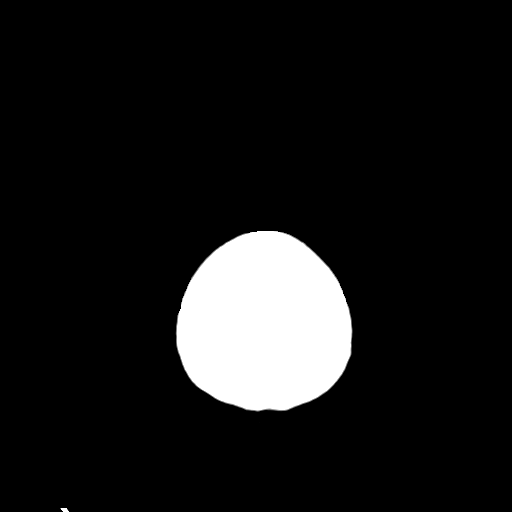
[im 29/30  brain]
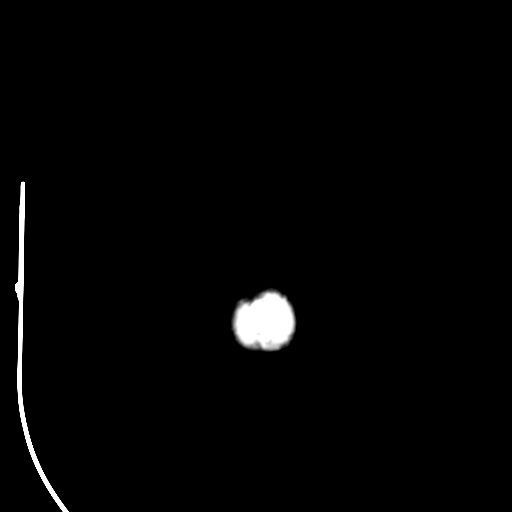

[16 of 30 positions shown; findings below may reference images not displayed]

FINDINGS: There is no acute intracranial hemorrhage or infarct. No mass lesion
or midline shift. Gray-white matter differentiation is well
maintained. Ventricles are normal in size without evidence of
hydrocephalus. CSF containing spaces are within normal limits. No
extra-axial fluid collection.

The calvarium is intact.

Orbital soft tissues are within normal limits.

Minimal opacity seen layering within the right sphenoid sinus.
Visualized paranasal sinuses are otherwise clear. No mastoid
effusion.

Scalp soft tissues are unremarkable.
IMPRESSION: Negative head CT with no acute intracranial process identified.

## 2020-10-19 ENCOUNTER — Other Ambulatory Visit: Payer: BLUE CROSS/BLUE SHIELD

## 2020-10-19 DIAGNOSIS — Z20822 Contact with and (suspected) exposure to covid-19: Secondary | ICD-10-CM

## 2020-10-20 LAB — NOVEL CORONAVIRUS, NAA: SARS-CoV-2, NAA: NOT DETECTED

## 2020-10-20 LAB — SARS-COV-2, NAA 2 DAY TAT

## 2021-06-19 DIAGNOSIS — N814 Uterovaginal prolapse, unspecified: Secondary | ICD-10-CM | POA: Insufficient documentation

## 2021-10-22 DIAGNOSIS — N812 Incomplete uterovaginal prolapse: Secondary | ICD-10-CM | POA: Insufficient documentation

## 2022-08-16 DIAGNOSIS — R41840 Attention and concentration deficit: Secondary | ICD-10-CM | POA: Diagnosis not present

## 2022-08-16 DIAGNOSIS — F419 Anxiety disorder, unspecified: Secondary | ICD-10-CM | POA: Diagnosis not present

## 2022-08-16 DIAGNOSIS — F338 Other recurrent depressive disorders: Secondary | ICD-10-CM | POA: Diagnosis not present

## 2022-09-11 DIAGNOSIS — R41840 Attention and concentration deficit: Secondary | ICD-10-CM | POA: Diagnosis not present

## 2022-09-12 DIAGNOSIS — F902 Attention-deficit hyperactivity disorder, combined type: Secondary | ICD-10-CM | POA: Diagnosis not present

## 2022-09-12 DIAGNOSIS — Z79899 Other long term (current) drug therapy: Secondary | ICD-10-CM | POA: Diagnosis not present

## 2022-10-15 DIAGNOSIS — R1011 Right upper quadrant pain: Secondary | ICD-10-CM | POA: Diagnosis not present

## 2022-10-30 ENCOUNTER — Other Ambulatory Visit: Payer: Self-pay

## 2022-10-30 ENCOUNTER — Observation Stay (HOSPITAL_BASED_OUTPATIENT_CLINIC_OR_DEPARTMENT_OTHER)
Admission: EM | Admit: 2022-10-30 | Discharge: 2022-10-31 | Disposition: A | Discharge status: Home / Self Care | Payer: BC Managed Care – PPO | Attending: Emergency Medicine | Admitting: Emergency Medicine

## 2022-10-30 ENCOUNTER — Emergency Department (HOSPITAL_BASED_OUTPATIENT_CLINIC_OR_DEPARTMENT_OTHER): Payer: BC Managed Care – PPO

## 2022-10-30 ENCOUNTER — Encounter (HOSPITAL_BASED_OUTPATIENT_CLINIC_OR_DEPARTMENT_OTHER): Payer: Self-pay

## 2022-10-30 DIAGNOSIS — Z20822 Contact with and (suspected) exposure to covid-19: Secondary | ICD-10-CM | POA: Diagnosis not present

## 2022-10-30 DIAGNOSIS — K8020 Calculus of gallbladder without cholecystitis without obstruction: Secondary | ICD-10-CM | POA: Diagnosis not present

## 2022-10-30 DIAGNOSIS — N83209 Unspecified ovarian cyst, unspecified side: Secondary | ICD-10-CM | POA: Insufficient documentation

## 2022-10-30 DIAGNOSIS — Z1152 Encounter for screening for COVID-19: Secondary | ICD-10-CM | POA: Diagnosis not present

## 2022-10-30 DIAGNOSIS — K810 Acute cholecystitis: Principal | ICD-10-CM | POA: Diagnosis present

## 2022-10-30 DIAGNOSIS — N83202 Unspecified ovarian cyst, left side: Secondary | ICD-10-CM | POA: Diagnosis present

## 2022-10-30 DIAGNOSIS — R1011 Right upper quadrant pain: Secondary | ICD-10-CM | POA: Diagnosis not present

## 2022-10-30 DIAGNOSIS — K8000 Calculus of gallbladder with acute cholecystitis without obstruction: Principal | ICD-10-CM | POA: Insufficient documentation

## 2022-10-30 DIAGNOSIS — Z79899 Other long term (current) drug therapy: Secondary | ICD-10-CM

## 2022-10-30 LAB — URINALYSIS, ROUTINE W REFLEX MICROSCOPIC
Bilirubin Urine: NEGATIVE
Glucose, UA: NEGATIVE mg/dL
Ketones, ur: 15 mg/dL — AB
Leukocytes,Ua: NEGATIVE
Nitrite: NEGATIVE
Protein, ur: NEGATIVE mg/dL
Specific Gravity, Urine: 1.019 (ref 1.005–1.030)
pH: 5.5 (ref 5.0–8.0)

## 2022-10-30 LAB — CBC
HCT: 42.7 % (ref 36.0–46.0)
Hemoglobin: 14 g/dL (ref 12.0–15.0)
MCH: 28.5 pg (ref 26.0–34.0)
MCHC: 32.8 g/dL (ref 30.0–36.0)
MCV: 87 fL (ref 80.0–100.0)
Platelets: 440 10*3/uL — ABNORMAL HIGH (ref 150–400)
RBC: 4.91 MIL/uL (ref 3.87–5.11)
RDW: 13.2 % (ref 11.5–15.5)
WBC: 11.1 10*3/uL — ABNORMAL HIGH (ref 4.0–10.5)
nRBC: 0 % (ref 0.0–0.2)

## 2022-10-30 LAB — COMPREHENSIVE METABOLIC PANEL
ALT: 47 U/L — ABNORMAL HIGH (ref 0–44)
AST: 34 U/L (ref 15–41)
Albumin: 4.3 g/dL (ref 3.5–5.0)
Alkaline Phosphatase: 72 U/L (ref 38–126)
Anion gap: 12 (ref 5–15)
BUN: 9 mg/dL (ref 6–20)
CO2: 24 mmol/L (ref 22–32)
Calcium: 9.1 mg/dL (ref 8.9–10.3)
Chloride: 102 mmol/L (ref 98–111)
Creatinine, Ser: 0.54 mg/dL (ref 0.44–1.00)
GFR, Estimated: >60 mL/min (ref >60)
Glucose, Bld: 97 mg/dL (ref 70–99)
Potassium: 3.8 mmol/L (ref 3.5–5.1)
Sodium: 138 mmol/L (ref 135–145)
Total Bilirubin: 0.4 mg/dL (ref 0.3–1.2)
Total Protein: 7.4 g/dL (ref 6.5–8.1)

## 2022-10-30 LAB — PREGNANCY, URINE: Preg Test, Ur: NEGATIVE

## 2022-10-30 LAB — LIPASE, BLOOD: Lipase: 12 U/L (ref 11–51)

## 2022-10-30 MED ORDER — FENTANYL CITRATE PF 50 MCG/ML IJ SOSY
50.0000 ug | PREFILLED_SYRINGE | Freq: Once | INTRAMUSCULAR | Status: AC
  Administered 2022-10-30: 50 ug via INTRAVENOUS
  Filled 2022-10-30: qty 1

## 2022-10-30 MED ORDER — ONDANSETRON HCL 4 MG/2ML IJ SOLN
4.0000 mg | Freq: Once | INTRAMUSCULAR | Status: AC
  Administered 2022-10-30: 4 mg via INTRAVENOUS
  Filled 2022-10-30: qty 2

## 2022-10-30 MED ORDER — MORPHINE SULFATE (PF) 4 MG/ML IV SOLN
4.0000 mg | Freq: Once | INTRAVENOUS | Status: AC
  Administered 2022-10-30: 4 mg via INTRAVENOUS
  Filled 2022-10-30: qty 1

## 2022-10-30 MED ORDER — SODIUM CHLORIDE 0.9 % IV SOLN
2.0000 g | Freq: Once | INTRAVENOUS | Status: AC
  Administered 2022-10-30: 2 g via INTRAVENOUS
  Filled 2022-10-30: qty 20

## 2022-10-30 MED ORDER — SODIUM CHLORIDE 0.9 % IV SOLN: Freq: Once | INTRAVENOUS | Status: AC

## 2022-10-30 MED ORDER — IOHEXOL 300 MG/ML  SOLN
100.0000 mL | Freq: Once | INTRAMUSCULAR | Status: AC | PRN
  Administered 2022-10-30: 85 mL via INTRAVENOUS

## 2022-10-30 NOTE — H&P (Signed)
CC: RUQ/MEG pain  HPI: Ashlee Robertson is an 51 y.o. female with no known medical hx presented to [place] with a 2-3 wk hx of vague MEG/RUQ pain - achy/gnawing, radiates to back. No aggrav/alleviating factors. Never had this kind of pain before. Denies any associated n/v. Has tried antacids without significant improvement. No fever/chills   Past Medical History:  Diagnosis Date   Varicose veins     Past Surgical History:  Procedure Laterality Date   ABLATION     uterine   laproscopy     ectopic preg    History reviewed. No pertinent family history.  Social:  reports that she has never smoked. She has never used smokeless tobacco. She reports that she does not drink alcohol and does not use drugs.  Allergies: No Known Allergies  Medications: I have reviewed the patient's current medications.  Results for orders placed or performed during the hospital encounter of 10/30/22 (from the past 48 hour(s))  Lipase, blood     Status: None   Collection Time: 10/30/22  2:41 PM  Result Value Ref Range   Lipase 12 11 - 51 U/L    Comment: Performed at [facility], [address] [place], [place] [address]  Comprehensive metabolic panel     Status: Abnormal   Collection Time: 10/30/22  2:41 PM  Result Value Ref Range   Sodium 138 135 - 145 mmol/L   Potassium 3.8 3.5 - 5.1 mmol/L   Chloride 102 98 - 111 mmol/L   CO2 24 22 - 32 mmol/L   Glucose, Bld 97 70 - 99 mg/dL    Comment: Glucose reference range applies only to samples taken after fasting for at least 8 hours.   BUN 9 6 - 20 mg/dL   Creatinine, Ser 0.54 0.44 - 1.00 mg/dL   Calcium 9.1 8.9 - 10.3 mg/dL   Total Protein 7.4 6.5 - 8.1 g/dL   Albumin 4.3 3.5 - 5.0 g/dL   AST 34 15 - 41 U/L   ALT 47 (H) 0 - 44 U/L   Alkaline Phosphatase 72 38 - 126 U/L   Total Bilirubin 0.4 0.3 - 1.2 mg/dL   GFR, Estimated >60 >60 mL/min    Comment: (NOTE) Calculated using the CKD-EPI Creatinine Equation (2021)     Anion gap 12 5 - 15    Comment: Performed at [facility], [address] [place], [place] [address]  CBC     Status: Abnormal   Collection Time: 10/30/22  2:41 PM  Result Value Ref Range   WBC 11.1 (H) 4.0 - 10.5 K/uL   RBC 4.91 3.87 - 5.11 MIL/uL   Hemoglobin 14.0 12.0 - 15.0 g/dL   HCT 42.7 36.0 - 46.0 %   MCV 87.0 80.0 - 100.0 fL   MCH 28.5 26.0 - 34.0 pg   MCHC 32.8 30.0 - 36.0 g/dL   RDW 13.2 11.5 - 15.5 %   Platelets 440 (H) 150 - 400 K/uL   nRBC 0.0 0.0 - 0.2 %    Comment: Performed at [facility], [address], [place], [place] [address]  Urinalysis, Routine w reflex microscopic     Status: Abnormal   Collection Time: 10/30/22  2:41 PM  Result Value Ref Range   Color, Urine YELLOW YELLOW   APPearance CLEAR CLEAR   Specific Gravity, Urine 1.019 1.005 - 1.030   pH 5.5 5.0 - 8.0   Glucose, UA NEGATIVE NEGATIVE mg/dL   Hgb  urine dipstick TRACE (A) NEGATIVE   Bilirubin Urine NEGATIVE NEGATIVE   Ketones, ur 15 (A) NEGATIVE mg/dL   Protein, ur NEGATIVE NEGATIVE mg/dL   Nitrite NEGATIVE NEGATIVE   Leukocytes,Ua NEGATIVE NEGATIVE   RBC / HPF 0-5 0 - 5 RBC/hpf   WBC, UA 0-5 0 - 5 WBC/hpf   Squamous Epithelial / LPF 0-5 0 - 5   Mucus PRESENT     Comment: Performed at [facility], [address] [place], [place] [address]  Pregnancy, urine     Status: None   Collection Time: 10/30/22  4:30 PM  Result Value Ref Range   Preg Test, Ur NEGATIVE NEGATIVE    Comment:        THE SENSITIVITY OF THIS METHODOLOGY IS >20 mIU/mL. Performed at [facility], [address], [place],  [address]     CT Abdomen Pelvis W Contrast  Result Date: 10/30/2022 CLINICAL DATA:  Abdominal pain, acute, nonlocalized EXAM: CT ABDOMEN AND PELVIS WITH CONTRAST TECHNIQUE: Multidetector CT imaging of the abdomen and pelvis was performed using the standard protocol following bolus administration of intravenous  contrast. RADIATION DOSE REDUCTION: This exam was performed according to the departmental dose-optimization program which includes automated exposure control, adjustment of the mA and/or kV according to patient size and/or use of iterative reconstruction technique. CONTRAST:  65mL OMNIPAQUE IOHEXOL 300 MG/ML  SOLN COMPARISON:  None Available. FINDINGS: Lower chest: No acute abnormality. Hepatobiliary: No focal liver abnormality. Calcified gallstones noted within the gallbladder lumen. Associated gallbladder wall thickening, pericholecystic fluid, pericholecystic fat stranding. No biliary dilatation. Pancreas: No focal lesion. Normal pancreatic contour. No surrounding inflammatory changes. No main pancreatic ductal dilatation. Spleen: Normal in size without focal abnormality. Adrenals/Urinary Tract: No adrenal nodule bilaterally. Bilateral kidneys enhance symmetrically. No hydronephrosis. No hydroureter. The urinary bladder is unremarkable. On delayed imaging, there is no urothelial wall thickening and there are no filling defects in the opacified portions of the bilateral collecting systems or ureters. Stomach/Bowel: Stomach is within normal limits. No evidence of bowel wall thickening or dilatation. Appendix appears normal. Vascular/Lymphatic: No abdominal aorta or iliac aneurysm. No abdominal, pelvic, or inguinal lymphadenopathy. Reproductive: There is a 6.3 x 5.2 cm fluid density lesion along the rectouterine pouch arising from the left ovary. Uterus and bilateral adnexa are unremarkable. Other: Trace right upper quadrant free fluid. No intraperitoneal free gas. No organized fluid collection. Musculoskeletal: No chest wall abnormality. No suspicious lytic or blastic osseous lesions. No acute displaced fracture. IMPRESSION: 1. Cholelithiasis with acute cholecystitis. Recommend surgical consultation. 2. A 6.3 cm left ovarian cyst. If patient postmenopausal: Recommend follow-up US in 6-12 months. Note: This  recommendation does not apply to premenarchal patients and to those with increased risk (genetic, family history, elevated tumor markers or other high-risk factors) of ovarian cancer. Reference: JACR 2020 Feb; 17(2):248-254 Electronically Signed   By: Iven Finn M.D.   On: 10/30/2022 17:49    ROS - all of the below systems have been reviewed with the patient and positives are indicated with bold text General: chills, fever or night sweats Eyes: blurry vision or double vision ENT: epistaxis or sore throat Allergy/Immunology: itchy/watery eyes or nasal congestion Hematologic/Lymphatic: bleeding problems, blood clots or swollen lymph nodes Endocrine: temperature intolerance or unexpected weight changes Breast: new or changing breast lumps or nipple discharge Resp: cough, shortness of breath, or wheezing CV: chest pain or dyspnea on exertion GI: as per HPI GU: dysuria, trouble voiding, or hematuria MSK: joint pain or joint  stiffness Neuro: TIA or stroke symptoms Derm: pruritus and skin lesion changes Psych: anxiety and depression  PE Blood pressure 133/61, pulse 92, temperature 98.1 F (36.7 C), temperature source Oral, resp. rate 16, height 5\' 4"  (1.626 m), weight 86.2 kg, SpO2 100 %. Constitutional: NAD; conversant Eyes: Moist conjunctiva; anicteric Lungs: Normal respiratory effort CV: RRR GI: Abd soft, focally ttp in RUQ, +Murphy's; nondistended; no tenderness elsewhere; no rebound nor guarding Psychiatric: Appropriate affect  Results for orders placed or performed during the hospital encounter of 10/30/22 (from the past 48 hour(s))  Lipase, blood     Status: None   Collection Time: 10/30/22  2:41 PM  Result Value Ref Range   Lipase 12 11 - 51 U/L    Comment: Performed at [facility], [address], [place], [place] [address]  Comprehensive metabolic panel     Status: Abnormal   Collection Time: 10/30/22  2:41 PM  Result Value Ref Range   Sodium 138 135  - 145 mmol/L   Potassium 3.8 3.5 - 5.1 mmol/L   Chloride 102 98 - 111 mmol/L   CO2 24 22 - 32 mmol/L   Glucose, Bld 97 70 - 99 mg/dL    Comment: Glucose reference range applies only to samples taken after fasting for at least 8 hours.   BUN 9 6 - 20 mg/dL   Creatinine, Ser 0.54 0.44 - 1.00 mg/dL   Calcium 9.1 8.9 - 10.3 mg/dL   Total Protein 7.4 6.5 - 8.1 g/dL   Albumin 4.3 3.5 - 5.0 g/dL   AST 34 15 - 41 U/L   ALT 47 (H) 0 - 44 U/L   Alkaline Phosphatase 72 38 - 126 U/L   Total Bilirubin 0.4 0.3 - 1.2 mg/dL   GFR, Estimated >60 >60 mL/min    Comment: (NOTE) Calculated using the CKD-EPI Creatinine Equation (2021)    Anion gap 12 5 - 15    Comment: Performed at [facility], [address], [place], [place] [address]  CBC     Status: Abnormal   Collection Time: 10/30/22  2:41 PM  Result Value Ref Range   WBC 11.1 (H) 4.0 - 10.5 K/uL   RBC 4.91 3.87 - 5.11 MIL/uL   Hemoglobin 14.0 12.0 - 15.0 g/dL   HCT 42.7 36.0 - 46.0 %   MCV 87.0 80.0 - 100.0 fL   MCH 28.5 26.0 - 34.0 pg   MCHC 32.8 30.0 - 36.0 g/dL   RDW 13.2 11.5 - 15.5 %   Platelets 440 (H) 150 - 400 K/uL   nRBC 0.0 0.0 - 0.2 %    Comment: Performed at [facility], [address] [place], [place] [address]  Urinalysis, Routine w reflex microscopic     Status: Abnormal   Collection Time: 10/30/22  2:41 PM  Result Value Ref Range   Color, Urine YELLOW YELLOW   APPearance CLEAR CLEAR   Specific Gravity, Urine 1.019 1.005 - 1.030   pH 5.5 5.0 - 8.0   Glucose, UA NEGATIVE NEGATIVE mg/dL   Hgb urine dipstick TRACE (A) NEGATIVE   Bilirubin Urine NEGATIVE NEGATIVE   Ketones, ur 15 (A) NEGATIVE mg/dL   Protein, ur NEGATIVE NEGATIVE mg/dL   Nitrite NEGATIVE NEGATIVE   Leukocytes,Ua NEGATIVE NEGATIVE   RBC / HPF 0-5 0 - 5 RBC/hpf   WBC, UA 0-5 0 - 5 WBC/hpf   Squamous Epithelial / LPF 0-5 0 - 5   Mucus PRESENT  Comment: Performed at [facility], [address] [place], [place] [address]  Pregnancy, urine     Status: None   Collection Time: 10/30/22  4:30 PM  Result Value Ref Range   Preg Test, Ur NEGATIVE NEGATIVE    Comment:        THE SENSITIVITY OF THIS METHODOLOGY IS >20 mIU/mL. Performed at [facility], [address], [place], [place] [address]     CT Abdomen Pelvis W Contrast  Result Date: 10/30/2022 CLINICAL DATA:  Abdominal pain, acute, nonlocalized EXAM: CT ABDOMEN AND PELVIS WITH CONTRAST TECHNIQUE: Multidetector CT imaging of the abdomen and pelvis was performed using the standard protocol following bolus administration of intravenous contrast. RADIATION DOSE REDUCTION: This exam was performed according to the departmental dose-optimization program which includes automated exposure control, adjustment of the mA and/or kV according to patient size and/or use of iterative reconstruction technique. CONTRAST:  55mL OMNIPAQUE IOHEXOL 300 MG/ML  SOLN COMPARISON:  None Available. FINDINGS: Lower chest: No acute abnormality. Hepatobiliary: No focal liver abnormality. Calcified gallstones noted within the gallbladder lumen. Associated gallbladder wall thickening, pericholecystic fluid, pericholecystic fat stranding. No biliary dilatation. Pancreas: No focal lesion. Normal pancreatic contour. No surrounding inflammatory changes. No main pancreatic ductal dilatation. Spleen: Normal in size without focal abnormality. Adrenals/Urinary Tract: No adrenal nodule bilaterally. Bilateral kidneys enhance symmetrically. No hydronephrosis. No hydroureter. The urinary bladder is unremarkable. On delayed imaging, there is no urothelial wall thickening and there are no filling defects in the opacified portions of the bilateral collecting systems or ureters. Stomach/Bowel: Stomach is within normal limits. No evidence of bowel wall thickening or dilatation. Appendix appears normal. Vascular/Lymphatic: No abdominal aorta or iliac  aneurysm. No abdominal, pelvic, or inguinal lymphadenopathy. Reproductive: There is a 6.3 x 5.2 cm fluid density lesion along the rectouterine pouch arising from the left ovary. Uterus and bilateral adnexa are unremarkable. Other: Trace right upper quadrant free fluid. No intraperitoneal free gas. No organized fluid collection. Musculoskeletal: No chest wall abnormality. No suspicious lytic or blastic osseous lesions. No acute displaced fracture. IMPRESSION: 1. Cholelithiasis with acute cholecystitis. Recommend surgical consultation. 2. A 6.3 cm left ovarian cyst. If patient postmenopausal: Recommend follow-up US in 6-12 months. Note: This recommendation does not apply to premenarchal patients and to those with increased risk (genetic, family history, elevated tumor markers or other high-risk factors) of ovarian cancer. Reference: JACR 2020 Feb; 17(2):248-254 Electronically Signed   By: Tish Frederickson M.D.   On: 10/30/2022 17:49     A/P: Ashlee Robertson is an 51 y.o. female with acute cholecystitis  -The anatomy and physiology of the hepatobiliary system was reviewed with her and her husband. The pathophysiology of gallbladder disease was discussed as well. -The options for treatment were discussed including ongoing observation which may result in subsequent gallbladder complications (infection, pancreatitis, choledocholithiasis, etc) - vs surgery - laparoscopic cholecystectomy  -The planned procedure, material risks (including, but not limited to, pain, bleeding, infection, scarring, need for blood transfusion, damage to surrounding structures- blood vessels/nerves/viscus/organs, damage to bile duct, bile leak, need for additional procedures, hernia, worsening of pre-existing medical conditions, pancreatitis, pneumonia, heart attack, stroke, death) benefits and alternatives to surgery were discussed at length. We have noted a good probability that the procedure would help improve her symptoms. The  patient's questions were answered to her satisfaction, she voiced understanding and they elected to proceed with surgery. Additionally, we discussed typical postoperative expectations and the recovery process.  -We discussed that the surgery would  be carried out by my partner, Dr. Rosendo Gros, tentatively tomorrow pending no other urgent surgical emergencies and that she would have the opportunity to meet with him discuss with him further prior to surgery.   I spent a total of 55 minutes in both face-to-face and non-face-to-face activities, excluding procedures performed, for this visit on the date of this encounter.  Nadeen Landau, [facility], [place]

## 2022-10-30 NOTE — ED Triage Notes (Signed)
Patient here POV from Home.  Endorses Mid ABD Pain that radiates to the Left, Right, and Upwards at different times. Present for a few weeks.   No N/V/D. No Fevers.   NAD noted during Triage. A&Ox4. GCS 15. Ambulatory.

## 2022-10-30 NOTE — Discharge Instructions (Signed)
You will need to follow-up in 4 to 6 weeks for repeat evaluation of  the ovarian cyst - please follow up with your gynecologist  [facility]  Please arrive at least 30 min before your appointment to complete your check in paperwork.  If you are unable to arrive 30 min prior to your appointment time we may have to cancel or reschedule you. LAPAROSCOPIC SURGERY: POST OP INSTRUCTIONS Always review your discharge instruction sheet given to you by the facility where your surgery was performed. IF YOU HAVE DISABILITY OR FAMILY LEAVE FORMS, YOU MUST BRING THEM TO THE OFFICE FOR PROCESSING.   DO NOT GIVE THEM TO YOUR DOCTOR.  PAIN CONTROL  First take acetaminophen (Tylenol) AND/or ibuprofen (Advil) to control your pain after surgery.  Follow directions on package.  Taking acetaminophen (Tylenol) and/or ibuprofen (Advil) regularly after surgery will help to control your pain and lower the amount of prescription pain medication you may need.  You should not take more than 4,000 mg (4 grams) of acetaminophen (Tylenol) in 24 hours.  You should not take ibuprofen (Advil), aleve, motrin, naprosyn or other NSAIDS if you have a history of stomach ulcers or chronic kidney disease.  A prescription for pain medication may be given to you upon discharge.  Take your pain medication as prescribed, if you still have uncontrolled pain after taking acetaminophen (Tylenol) or ibuprofen (Advil). Use ice packs to help control pain. If you need a refill on your pain medication, please contact your pharmacy.  They will contact our office to request authorization. Prescriptions will not be filled after 5pm or on week-ends.  HOME MEDICATIONS Take your usually prescribed medications unless otherwise directed.  DIET You should follow a light diet the first few days after arrival home.  Be sure to include lots of fluids daily. Avoid fatty, fried foods.   CONSTIPATION It is common to experience some  constipation after surgery and if you are taking pain medication.  Increasing fluid intake and taking a stool softener (such as Colace) will usually help or prevent this problem from occurring.  A mild laxative (Milk of Magnesia or Miralax) should be taken according to package instructions if there are no bowel movements after 48 hours.  WOUND/INCISION CARE Most patients will experience some swelling and bruising in the area of the incisions.  Ice packs will help.  Swelling and bruising can take several days to resolve.  Unless discharge instructions indicate otherwise, follow guidelines below  STERI-STRIPS - you may remove your outer bandages 48 hours after surgery, and you may shower at that time.  You have steri-strips (small skin tapes) in place directly over the incision.  These strips should be left on the skin for 7-10 days.   DERMABOND/SKIN GLUE - you may shower in 24 hours.  The glue will flake off over the next 2-3 weeks. Any sutures or staples will be removed at the office during your follow-up visit.  ACTIVITIES You may resume regular (light) daily activities beginning the next day--such as daily self-care, walking, climbing stairs--gradually increasing activities as tolerated.  You may have sexual intercourse when it is comfortable.  Refrain from any heavy lifting or straining until approved by your doctor. You may drive when you are no longer taking prescription pain medication, you can comfortably wear a seatbelt, and you can safely maneuver your car and apply brakes.  FOLLOW-UP You should see your doctor in the office for a follow-up appointment approximately 2-3 weeks after your  surgery.  You should have been given your post-op/follow-up appointment when your surgery was scheduled.  If you did not receive a post-op/follow-up appointment, make sure that you call for this appointment within a day or two after you arrive home to insure a convenient appointment time.  OTHER  INSTRUCTIONS  WHEN TO CALL YOUR DOCTOR: Fever over 101.0 Inability to urinate Continued bleeding from incision. Increased pain, redness, or drainage from the incision. Increasing abdominal pain  The clinic staff is available to answer your questions during regular business hours.  Please don't hesitate to call and ask to speak to one of the nurses for clinical concerns.  If you have a medical emergency, go to the nearest emergency room or call 911.  A surgeon from [facility] is always on call at the hospital. [address], [place], [place]  [address] ? [address] [address], [place], [place]   [address] [phone] ? [phone] ? FAX [phone]

## 2022-10-30 NOTE — ED Notes (Signed)
Dr. White at bedside.

## 2022-10-30 NOTE — ED Provider Notes (Signed)
  Provider Note MRN:  [id number]  Arrival date & time: 10/30/22    ED Course and Medical Decision Making  Assumed care from Dr. Theresia Lo at shift change.  Cholecystitis awaiting general surgery evaluation and likely admission.  Procedures  Final Clinical Impressions(s) / ED Diagnoses     ICD-10-CM   1. Acute cholecystitis due to biliary calculus  K80.00     2. Cyst of ovary, unspecified laterality  N83.209       ED Discharge Orders     None         Discharge Instructions      Go directly to the emergency department at [facility].  You will need to follow-up in 4 to 6 weeks for repeat ultrasound of the ovarian cyst.  Do not eat any food or fluid prior to arrival in the emergency department.    Elmer Sow. Pilar Plate, MD [facility] [email or web address]    Sabas Sous, MD 10/31/22 [phone]

## 2022-10-30 NOTE — ED Provider Notes (Signed)
  [facility] EMERGENCY DEPT Provider Note   CSN: [id number] Arrival date & time: 10/30/22  1419     History {Add pertinent medical, surgical, social history, OB history to HPI:1} Chief Complaint  Patient presents with   Abdominal Pain    Ashlee Robertson is a 51 y.o. female.  The history is provided by the patient. No language interpreter was used.  Abdominal Pain Pain location:  Epigastric, RUQ and LUQ Pain quality: aching and gnawing   Pain radiates to:  Back Pain severity:  Moderate Onset quality:  Gradual Duration:  3 weeks Timing:  Constant Progression:  Unchanged Chronicity:  New Relieved by:  Nothing Worsened by:  Nothing Ineffective treatments:  Antacids, acetaminophen, flatus and bowel activity Associated symptoms: no hematochezia, no melena, no nausea and no vomiting        Home Medications Prior to Admission medications   Medication Sig Start Date End Date Taking? Authorizing Provider  Cholecalciferol (VITAMIN D-3 PO) Take 1 tablet by mouth daily.    [provider]  Cyanocobalamin (VITAMIN B-12 PO) Take 1 tablet by mouth daily.    [provider]      Allergies    Patient has no known allergies.    Review of Systems   Review of Systems  Gastrointestinal:  Positive for abdominal pain. Negative for hematochezia, melena, nausea and vomiting.    Physical Exam Updated Vital Signs BP 124/60   Pulse 92   Temp 98.1 F (36.7 C) (Oral)   Resp 18   Ht 5\' 4"  (1.626 m)   Wt 86.2 kg   SpO2 100%   BMI 32.62 kg/m  Physical Exam  ED Results / Procedures / Treatments   Labs (all labs ordered are listed, but only abnormal results are displayed) Labs Reviewed  COMPREHENSIVE METABOLIC PANEL - Abnormal; Notable for the following components:      Result Value   ALT 47 (*)    All other components within normal limits  CBC - Abnormal; Notable for the following components:   WBC 11.1 (*)    Platelets 440 (*)    All other  components within normal limits  URINALYSIS, ROUTINE W REFLEX MICROSCOPIC - Abnormal; Notable for the following components:   Hgb urine dipstick TRACE (*)    Ketones, ur 15 (*)    All other components within normal limits  LIPASE, BLOOD    EKG None  Radiology No results found.  Procedures Procedures  {Document cardiac monitor, telemetry assessment procedure when appropriate:1}  Medications Ordered in ED Medications - No data to display  ED Course/ Medical Decision Making/ A&P                           Medical Decision Making Amount and/or Complexity of Data Reviewed Labs: ordered.   ***  {Document critical care time when appropriate:1} {Document review of labs and clinical decision tools ie heart score, Chads2Vasc2 etc:1}  {Document your independent review of radiology images, and any outside records:1} {Document your discussion with family members, caretakers, and with consultants:1} {Document social determinants of health affecting pt's care:1} {Document your decision making why or why not admission, treatments were needed:1} Final Clinical Impression(s) / ED Diagnoses Final diagnoses:  None    Rx / DC Orders ED Discharge Orders     None

## 2022-10-30 NOTE — ED Provider Notes (Signed)
Patient was initially evaluated at [facility] by Arthor Captain, PA and was transferred here for surgical evaluation for acute cholecystitis.  In short this is a 51 year old female with no significant past medical history who is presented to the emergency department with daily epigastric abdominal pain radiating across to her left and right upper quadrants into her back.  On arrival here she is afebrile, mildly tachycardic otherwise hemodynamically stable.  Her heart is regular rate and rhythm, lungs are clear to auscultation bilaterally, abdomen is soft and is diffusely tender across the top of her abdomen.  She will be given additional dose of pain and nausea control and will be given antibiotics.  Surgery will be notified that she has arrived further evaluation.   Ashlee Maus, DO 10/30/22 2048

## 2022-10-30 NOTE — ED Notes (Signed)
Pt being transferred to [facility] ED POV. Governor Rooks, driving and aware to go straight to ED upon leaving here. Pt alert and oriented x 4. IV wrapped with Kerlix for safe transport

## 2022-10-31 ENCOUNTER — Other Ambulatory Visit: Payer: Self-pay

## 2022-10-31 ENCOUNTER — Encounter (HOSPITAL_COMMUNITY): Payer: Self-pay

## 2022-10-31 ENCOUNTER — Inpatient Hospital Stay (HOSPITAL_COMMUNITY): Payer: BC Managed Care – PPO | Admitting: Critical Care Medicine

## 2022-10-31 ENCOUNTER — Encounter (HOSPITAL_COMMUNITY)
Admission: EM | Disposition: A | Discharge status: Home / Self Care | Payer: Self-pay | Source: Home / Self Care | Attending: Emergency Medicine

## 2022-10-31 DIAGNOSIS — K810 Acute cholecystitis: Secondary | ICD-10-CM

## 2022-10-31 DIAGNOSIS — Z79899 Other long term (current) drug therapy: Secondary | ICD-10-CM | POA: Diagnosis not present

## 2022-10-31 DIAGNOSIS — N83202 Unspecified ovarian cyst, left side: Secondary | ICD-10-CM | POA: Diagnosis present

## 2022-10-31 DIAGNOSIS — Z20822 Contact with and (suspected) exposure to covid-19: Secondary | ICD-10-CM | POA: Diagnosis present

## 2022-10-31 HISTORY — PX: CHOLECYSTECTOMY: SHX55

## 2022-10-31 LAB — COMPREHENSIVE METABOLIC PANEL
ALT: 40 U/L (ref 0–44)
AST: 27 U/L (ref 15–41)
Albumin: 3.3 g/dL — ABNORMAL LOW (ref 3.5–5.0)
Alkaline Phosphatase: 77 U/L (ref 38–126)
Anion gap: 11 (ref 5–15)
BUN: 7 mg/dL (ref 6–20)
CO2: 27 mmol/L (ref 22–32)
Calcium: 8.8 mg/dL — ABNORMAL LOW (ref 8.9–10.3)
Chloride: 101 mmol/L (ref 98–111)
Creatinine, Ser: 0.67 mg/dL (ref 0.44–1.00)
GFR, Estimated: >60 mL/min (ref >60)
Glucose, Bld: 110 mg/dL — ABNORMAL HIGH (ref 70–99)
Potassium: 3.6 mmol/L (ref 3.5–5.1)
Sodium: 139 mmol/L (ref 135–145)
Total Bilirubin: 0.6 mg/dL (ref 0.3–1.2)
Total Protein: 6.5 g/dL (ref 6.5–8.1)

## 2022-10-31 LAB — CBC
HCT: 42 % (ref 36.0–46.0)
Hemoglobin: 13.4 g/dL (ref 12.0–15.0)
MCH: 28.7 pg (ref 26.0–34.0)
MCHC: 31.9 g/dL (ref 30.0–36.0)
MCV: 89.9 fL (ref 80.0–100.0)
Platelets: 401 10*3/uL — ABNORMAL HIGH (ref 150–400)
RBC: 4.67 MIL/uL (ref 3.87–5.11)
RDW: 13.2 % (ref 11.5–15.5)
WBC: 8.3 10*3/uL (ref 4.0–10.5)
nRBC: 0 % (ref 0.0–0.2)

## 2022-10-31 LAB — HIV ANTIBODY (ROUTINE TESTING W REFLEX): HIV Screen 4th Generation wRfx: NONREACTIVE

## 2022-10-31 SURGERY — LAPAROSCOPIC CHOLECYSTECTOMY
Anesthesia: General

## 2022-10-31 MED ORDER — ARTIFICIAL TEARS OPHTHALMIC OINT
TOPICAL_OINTMENT | OPHTHALMIC | Status: AC
  Filled 2022-10-31: qty 3.5

## 2022-10-31 MED ORDER — POLYETHYLENE GLYCOL 3350 17 G PO PACK: 17.0000 g | PACK | Freq: Every day | ORAL | 0 refills | Status: DC | PRN

## 2022-10-31 MED ORDER — SIMETHICONE 80 MG PO CHEW: 40.0000 mg | CHEWABLE_TABLET | Freq: Four times a day (QID) | ORAL | Status: DC | PRN

## 2022-10-31 MED ORDER — CHLORHEXIDINE GLUCONATE 0.12 % MT SOLN
OROMUCOSAL | Status: AC
  Administered 2022-10-31: 15 mL via OROMUCOSAL
  Filled 2022-10-31: qty 15

## 2022-10-31 MED ORDER — OXYCODONE HCL 5 MG PO TABS: 5.0000 mg | ORAL_TABLET | Freq: Once | ORAL | Status: DC | PRN

## 2022-10-31 MED ORDER — ROCURONIUM BROMIDE 10 MG/ML (PF) SYRINGE
PREFILLED_SYRINGE | INTRAVENOUS | Status: AC
  Filled 2022-10-31: qty 10

## 2022-10-31 MED ORDER — PIPERACILLIN-TAZOBACTAM 3.375 G IVPB 30 MIN
3.3750 g | Freq: Once | INTRAVENOUS | Status: AC
  Administered 2022-10-31: 3.375 g via INTRAVENOUS
  Filled 2022-10-31: qty 50

## 2022-10-31 MED ORDER — TRAMADOL HCL 50 MG PO TABS: 50.0000 mg | ORAL_TABLET | Freq: Four times a day (QID) | ORAL | Status: DC | PRN

## 2022-10-31 MED ORDER — DIPHENHYDRAMINE HCL 50 MG/ML IJ SOLN: 12.5000 mg | Freq: Four times a day (QID) | INTRAMUSCULAR | Status: DC | PRN

## 2022-10-31 MED ORDER — ONDANSETRON HCL 4 MG/2ML IJ SOLN
INTRAMUSCULAR | Status: DC | PRN
  Administered 2022-10-31: 4 mg via INTRAVENOUS

## 2022-10-31 MED ORDER — PROPOFOL 10 MG/ML IV BOLUS
INTRAVENOUS | Status: AC
  Filled 2022-10-31: qty 20

## 2022-10-31 MED ORDER — OXYCODONE HCL 5 MG/5ML PO SOLN: 5.0000 mg | Freq: Once | ORAL | Status: DC | PRN

## 2022-10-31 MED ORDER — SUGAMMADEX SODIUM 500 MG/5ML IV SOLN
INTRAVENOUS | Status: AC
  Filled 2022-10-31: qty 5

## 2022-10-31 MED ORDER — FENTANYL CITRATE (PF) 250 MCG/5ML IJ SOLN
INTRAMUSCULAR | Status: DC | PRN
  Administered 2022-10-31 (×3): 50 ug via INTRAVENOUS

## 2022-10-31 MED ORDER — MIDAZOLAM HCL 2 MG/2ML IJ SOLN
INTRAMUSCULAR | Status: AC
  Filled 2022-10-31: qty 2

## 2022-10-31 MED ORDER — ONDANSETRON HCL 4 MG/2ML IJ SOLN: 4.0000 mg | Freq: Four times a day (QID) | INTRAMUSCULAR | Status: DC | PRN

## 2022-10-31 MED ORDER — ENOXAPARIN SODIUM 40 MG/0.4ML IJ SOSY: 40.0000 mg | PREFILLED_SYRINGE | INTRAMUSCULAR | Status: DC

## 2022-10-31 MED ORDER — DEXAMETHASONE SODIUM PHOSPHATE 10 MG/ML IJ SOLN
INTRAMUSCULAR | Status: DC | PRN
  Administered 2022-10-31: 4 mg via INTRAVENOUS

## 2022-10-31 MED ORDER — ONDANSETRON HCL 4 MG/2ML IJ SOLN
INTRAMUSCULAR | Status: AC
  Filled 2022-10-31: qty 4

## 2022-10-31 MED ORDER — ONDANSETRON 4 MG PO TBDP: 4.0000 mg | ORAL_TABLET | Freq: Four times a day (QID) | ORAL | Status: DC | PRN

## 2022-10-31 MED ORDER — DIPHENHYDRAMINE HCL 50 MG/ML IJ SOLN
INTRAMUSCULAR | Status: DC | PRN
  Administered 2022-10-31: 12.5 mg via INTRAVENOUS

## 2022-10-31 MED ORDER — MELATONIN 3 MG PO TABS: 3.0000 mg | ORAL_TABLET | Freq: Every evening | ORAL | Status: DC | PRN

## 2022-10-31 MED ORDER — FENTANYL CITRATE (PF) 100 MCG/2ML IJ SOLN
INTRAMUSCULAR | Status: AC
  Filled 2022-10-31: qty 2

## 2022-10-31 MED ORDER — IBUPROFEN 400 MG PO TABS: 600.0000 mg | ORAL_TABLET | Freq: Four times a day (QID) | ORAL | Status: DC | PRN

## 2022-10-31 MED ORDER — DEXAMETHASONE SODIUM PHOSPHATE 10 MG/ML IJ SOLN
INTRAMUSCULAR | Status: AC
  Filled 2022-10-31: qty 2

## 2022-10-31 MED ORDER — DIPHENHYDRAMINE HCL 12.5 MG/5ML PO ELIX: 12.5000 mg | ORAL_SOLUTION | Freq: Four times a day (QID) | ORAL | Status: DC | PRN

## 2022-10-31 MED ORDER — 0.9 % SODIUM CHLORIDE (POUR BTL) OPTIME
TOPICAL | Status: DC | PRN
  Administered 2022-10-31: 1000 mL

## 2022-10-31 MED ORDER — FENTANYL CITRATE (PF) 250 MCG/5ML IJ SOLN
INTRAMUSCULAR | Status: AC
  Filled 2022-10-31: qty 5

## 2022-10-31 MED ORDER — HYDROMORPHONE HCL 1 MG/ML IJ SOLN
0.5000 mg | INTRAMUSCULAR | Status: DC | PRN
  Administered 2022-10-31: 0.5 mg via INTRAVENOUS
  Filled 2022-10-31: qty 1

## 2022-10-31 MED ORDER — LACTATED RINGERS IV SOLN: INTRAVENOUS | Status: DC

## 2022-10-31 MED ORDER — PROPOFOL 10 MG/ML IV BOLUS
INTRAVENOUS | Status: DC | PRN
  Administered 2022-10-31: 150 mg via INTRAVENOUS

## 2022-10-31 MED ORDER — TRAMADOL HCL 50 MG PO TABS: 50.0000 mg | ORAL_TABLET | Freq: Four times a day (QID) | ORAL | 0 refills | Status: DC | PRN

## 2022-10-31 MED ORDER — EPHEDRINE 5 MG/ML INJ
INTRAVENOUS | Status: AC
  Filled 2022-10-31: qty 10

## 2022-10-31 MED ORDER — SUGAMMADEX SODIUM 200 MG/2ML IV SOLN
INTRAVENOUS | Status: DC | PRN
  Administered 2022-10-31: 200 mg via INTRAVENOUS

## 2022-10-31 MED ORDER — HYDRALAZINE HCL 20 MG/ML IJ SOLN: 10.0000 mg | INTRAMUSCULAR | Status: DC | PRN

## 2022-10-31 MED ORDER — SODIUM CHLORIDE 0.9 % IR SOLN
Status: DC | PRN
  Administered 2022-10-31: 1000 mL

## 2022-10-31 MED ORDER — ORAL CARE MOUTH RINSE: 15.0000 mL | Freq: Once | OROMUCOSAL | Status: AC

## 2022-10-31 MED ORDER — SUCCINYLCHOLINE CHLORIDE 200 MG/10ML IV SOSY
PREFILLED_SYRINGE | INTRAVENOUS | Status: DC | PRN
  Administered 2022-10-31: 120 mg via INTRAVENOUS

## 2022-10-31 MED ORDER — MIDAZOLAM HCL 5 MG/5ML IJ SOLN
INTRAMUSCULAR | Status: DC | PRN
  Administered 2022-10-31: 2 mg via INTRAVENOUS

## 2022-10-31 MED ORDER — CHLORHEXIDINE GLUCONATE 0.12 % MT SOLN: 15.0000 mL | Freq: Once | OROMUCOSAL | Status: AC

## 2022-10-31 MED ORDER — PIPERACILLIN-TAZOBACTAM 3.375 G IVPB
3.3750 g | Freq: Three times a day (TID) | INTRAVENOUS | Status: DC
  Administered 2022-10-31: 3.375 g via INTRAVENOUS
  Filled 2022-10-31: qty 50

## 2022-10-31 MED ORDER — BUPIVACAINE-EPINEPHRINE (PF) 0.25% -1:200000 IJ SOLN
INTRAMUSCULAR | Status: AC
  Filled 2022-10-31: qty 30

## 2022-10-31 MED ORDER — ROCURONIUM BROMIDE 10 MG/ML (PF) SYRINGE
PREFILLED_SYRINGE | INTRAVENOUS | Status: DC | PRN
  Administered 2022-10-31: 40 mg via INTRAVENOUS

## 2022-10-31 MED ORDER — LIDOCAINE 2% (20 MG/ML) 5 ML SYRINGE
INTRAMUSCULAR | Status: DC | PRN
  Administered 2022-10-31: 100 mg via INTRAVENOUS

## 2022-10-31 MED ORDER — ACETAMINOPHEN 500 MG PO TABS
1000.0000 mg | ORAL_TABLET | Freq: Four times a day (QID) | ORAL | Status: DC
  Administered 2022-10-31 (×3): 1000 mg via ORAL
  Filled 2022-10-31 (×3): qty 2

## 2022-10-31 MED ORDER — FENTANYL CITRATE (PF) 100 MCG/2ML IJ SOLN
25.0000 ug | INTRAMUSCULAR | Status: DC | PRN
  Administered 2022-10-31: 25 ug via INTRAVENOUS

## 2022-10-31 MED ORDER — BUPIVACAINE HCL 0.25 % IJ SOLN
INTRAMUSCULAR | Status: DC | PRN
  Administered 2022-10-31: 10 mL

## 2022-10-31 SURGICAL SUPPLY — 44 items
BAG COUNTER SPONGE SURGICOUNT (BAG) ×1 IMPLANT
CANISTER SUCT 3000ML PPV (MISCELLANEOUS) ×1 IMPLANT
CHLORAPREP W/TINT 26 (MISCELLANEOUS) ×1 IMPLANT
CLIP LIGATING HEMO O LOK GREEN (MISCELLANEOUS) ×1 IMPLANT
COVER SURGICAL LIGHT HANDLE (MISCELLANEOUS) ×1 IMPLANT
COVER TRANSDUCER ULTRASND (DRAPES) ×1 IMPLANT
DERMABOND ADVANCED .7 DNX12 (GAUZE/BANDAGES/DRESSINGS) ×1 IMPLANT
ELECT REM PT RETURN 9FT ADLT (ELECTROSURGICAL) ×1
ELECTRODE REM PT RTRN 9FT ADLT (ELECTROSURGICAL) ×1 IMPLANT
ENDOLOOP SUT PDS II  0 18 (SUTURE) ×1
ENDOLOOP SUT PDS II 0 18 (SUTURE) IMPLANT
GLOVE BIO SURGEON STRL SZ7.5 (GLOVE) ×1 IMPLANT
GLOVE SURG SYN 7.5  E (GLOVE) ×1
GLOVE SURG SYN 7.5 E (GLOVE) ×1 IMPLANT
GLOVE SURG SYN 7.5 PF PI (GLOVE) ×1 IMPLANT
GOWN STRL REUS W/ TWL LRG LVL3 (GOWN DISPOSABLE) ×2 IMPLANT
GOWN STRL REUS W/ TWL XL LVL3 (GOWN DISPOSABLE) ×1 IMPLANT
GOWN STRL REUS W/TWL LRG LVL3 (GOWN DISPOSABLE) ×2
GOWN STRL REUS W/TWL XL LVL3 (GOWN DISPOSABLE) ×1
GRASPER SUT TROCAR 14GX15 (MISCELLANEOUS) ×1 IMPLANT
KIT BASIN OR (CUSTOM PROCEDURE TRAY) ×1 IMPLANT
KIT TURNOVER KIT B (KITS) ×1 IMPLANT
NDL INSUFFLATION 14GA 120MM (NEEDLE) ×1 IMPLANT
NEEDLE INSUFFLATION 14GA 120MM (NEEDLE) ×1 IMPLANT
NS IRRIG 1000ML POUR BTL (IV SOLUTION) ×1 IMPLANT
PAD ARMBOARD 7.5X6 YLW CONV (MISCELLANEOUS) ×1 IMPLANT
POUCH LAPAROSCOPIC INSTRUMENT (MISCELLANEOUS) ×1 IMPLANT
POUCH RETRIEVAL ECOSAC 10 (ENDOMECHANICALS) IMPLANT
POUCH RETRIEVAL ECOSAC 10MM (ENDOMECHANICALS)
SCISSORS LAP 5X35 DISP (ENDOMECHANICALS) ×1 IMPLANT
SET IRRIG TUBING LAPAROSCOPIC (IRRIGATION / IRRIGATOR) ×1 IMPLANT
SET TUBE SMOKE EVAC HIGH FLOW (TUBING) ×1 IMPLANT
SLEEVE ENDOPATH XCEL 5M (ENDOMECHANICALS) ×1 IMPLANT
SPECIMEN JAR SMALL (MISCELLANEOUS) ×1 IMPLANT
SUT MNCRL AB 4-0 PS2 18 (SUTURE) ×1 IMPLANT
SUT PDS 2 0 (SUTURE) IMPLANT
SUT VICRYL 0 ENDOLOOP (SUTURE) IMPLANT
TOWEL GREEN STERILE (TOWEL DISPOSABLE) ×1 IMPLANT
TOWEL GREEN STERILE FF (TOWEL DISPOSABLE) ×1 IMPLANT
TRAY LAPAROSCOPIC MC (CUSTOM PROCEDURE TRAY) ×1 IMPLANT
TROCAR XCEL NON-BLD 11X100MML (ENDOMECHANICALS) ×1 IMPLANT
TROCAR Z-THREAD OPTICAL 5X100M (TROCAR) ×1 IMPLANT
WARMER LAPAROSCOPE (MISCELLANEOUS) ×1 IMPLANT
WATER STERILE IRR 1000ML POUR (IV SOLUTION) ×1 IMPLANT

## 2022-10-31 NOTE — ED Notes (Signed)
All belongings sent with patient's husband prior to being transferred to short stay. Consent sent with patient as well.

## 2022-10-31 NOTE — ED Notes (Signed)
Report called to short stay RN Marissa. Will transport patient and husband to [facility] at this time.

## 2022-10-31 NOTE — Op Note (Signed)
10/31/2022  3:12 PM  PATIENT:  Ashlee Robertson  51 y.o. female  PRE-OPERATIVE DIAGNOSIS:  Acute Cholecystits  POST-OPERATIVE DIAGNOSIS:  Acute Cholecystits  PROCEDURE:  Procedure(s): LAPAROSCOPIC CHOLECYSTECTOMY (N/A)  SURGEON:  Surgeon(s) and Role:    * Axel Filler, MD - Primary   ASSISTANTS: Lu Duffel, MD- PGY-5    ANESTHESIA:   local and general  EBL:  20 mL   BLOOD ADMINISTERED:none  DRAINS: none   LOCAL MEDICATIONS USED:  BUPIVICAINE   SPECIMEN:  Source of Specimen:  gallbladder  DISPOSITION OF SPECIMEN:  PATHOLOGY  COUNTS:  YES  TOURNIQUET:  * No tourniquets in log *  DICTATION: .Dragon Dictation The patient was taken to the operating and placed in the supine position with bilateral SCDs in place.  The patient was prepped and draped in the usual sterile fashion. A time out was called and all facts were verified. A pneumoperitoneum was obtained via A Veress needle technique to a pressure of 16mm of mercury.  A 60mm trochar was then placed in the right upper quadrant under visualization, and there were no injuries to any abdominal organs. A 11 mm port was then placed in the umbilical region after infiltrating with local anesthesia under direct visualization. A second and third epigastric port and right lower quadrant port placement under direct visualization, respectively.    The gallbladder was identified and retracted, the peritoneum was then sharply dissected from the gallbladder and this dissection was carried down to Calot's triangle. The cystic duct was identified and stripped away circumferentially and seen going into the gallbladder 360, the critical angle was obtained.  2 clips were placed proximally one distally and the cystic duct transected. The cystic artery was identified and 2 clips placed proximally and one distally and transected.  The clips were close to the ductotomy so 0PDS endoloop was placed at the base of the cystic duct.   We then  proceeded to remove the gallbladder off the hepatic fossa with Bovie cautery. A retrieval bag was then placed in the abdomen and gallbladder placed in the bag. The hepatic fossa was then reexamined and hemostasis was achieved with Bovie cautery and was excellent at the end of the case.   The subhepatic fossa and perihepatic fossa was then irrigated until the effluent was clear.  The gallbladder and bag were removed from the abdominal cavity. The 11 mm trocar fascia was reapproximated with the Endo Close #1 Vicryl 2.  The pneumoperitoneum was evacuated and all trochars removed under direct visulalization.  The skin was then closed with 4-0 Monocryl and the skin dressed with Dermabond.    The patient was awaken from general anesthesia and taken to the recovery room in stable condition.    PLAN OF CARE: Discharge to home after PACU  PATIENT DISPOSITION:  PACU - hemodynamically stable.   Delay start of Pharmacological VTE agent (>24hrs) due to surgical blood loss or risk of bleeding: not applicable

## 2022-10-31 NOTE — Interval H&P Note (Signed)
History and Physical Interval Note:  10/31/2022 1:22 PM  Ashlee Robertson  has presented today for surgery, with the diagnosis of Acute Cholecystits.  The various methods of treatment have been discussed with the patient and family. After consideration of risks, benefits and other options for treatment, the patient has consented to  Procedure(s): LAPAROSCOPIC CHOLECYSTECTOMY (N/A) as a surgical intervention.  The patient's history has been reviewed, patient examined, no change in status, stable for surgery.  I have reviewed the patient's chart and labs.  Questions were answered to the patient's satisfaction.     Axel Filler

## 2022-10-31 NOTE — Anesthesia Preprocedure Evaluation (Signed)
Anesthesia Evaluation  Patient identified by MRN, date of birth, ID band Patient awake    Reviewed: Allergy & Precautions, H&P , NPO status , Patient's Chart, lab work & pertinent test results  Airway Mallampati: II   Neck ROM: full    Dental   Pulmonary neg pulmonary ROS   breath sounds clear to auscultation       Cardiovascular negative cardio ROS  Rhythm:regular Rate:Normal     Neuro/Psych    GI/Hepatic Acute cholecystitis   Endo/Other    Renal/GU      Musculoskeletal   Abdominal   Peds  Hematology   Anesthesia Other Findings   Reproductive/Obstetrics                             Anesthesia Physical Anesthesia Plan  ASA: 2  Anesthesia Plan: General   Post-op Pain Management:    Induction: Intravenous  PONV Risk Score and Plan: 3 and Ondansetron, Dexamethasone, Midazolam and Treatment may vary due to age or medical condition  Airway Management Planned: Oral ETT  Additional Equipment:   Intra-op Plan:   Post-operative Plan: Extubation in OR  Informed Consent: I have reviewed the patients History and Physical, chart, labs and discussed the procedure including the risks, benefits and alternatives for the proposed anesthesia with the patient or authorized representative who has indicated his/her understanding and acceptance.     Dental advisory given  Plan Discussed with: CRNA, Anesthesiologist and Surgeon  Anesthesia Plan Comments:        Anesthesia Quick Evaluation

## 2022-10-31 NOTE — Anesthesia Procedure Notes (Signed)
Procedure Name: Intubation Date/Time: 10/31/2022 1:59 PM  Performed by: Wilburn Cornelia, CRNAPre-anesthesia Checklist: Patient identified, Emergency Drugs available, Suction available, Patient being monitored and Timeout performed Patient Re-evaluated:Patient Re-evaluated prior to induction Oxygen Delivery Method: Circle system utilized Preoxygenation: Pre-oxygenation with 100% oxygen Induction Type: IV induction, Cricoid Pressure applied and Rapid sequence Laryngoscope Size: Mac and 3 Grade View: Grade III Tube type: Oral Tube size: 7.0 mm Number of attempts: 1 Airway Equipment and Method: Stylet Placement Confirmation: ETT inserted through vocal cords under direct vision, positive ETCO2, CO2 detector and breath sounds checked- equal and bilateral Secured at: 21 cm Tube secured with: Tape Dental Injury: Teeth and Oropharynx as per pre-operative assessment

## 2022-10-31 NOTE — ED Notes (Signed)
Obtained informed consent for procedure

## 2022-10-31 NOTE — Transfer of Care (Signed)
Immediate Anesthesia Transfer of Care Note  Patient: Ashlee Robertson  Procedure(s) Performed: LAPAROSCOPIC CHOLECYSTECTOMY  Patient Location: PACU  Anesthesia Type:General  Level of Consciousness: drowsy  Airway & Oxygen Therapy: Patient Spontanous Breathing  Post-op Assessment: Report given to RN and Post -op Vital signs reviewed and stable  Post vital signs: Reviewed and stable  Last Vitals:  Vitals Value Taken Time  BP 142/65 10/31/22 1530  Temp 36.6 C 10/31/22 1530  Pulse 87 10/31/22 1531  Resp 17 10/31/22 1531  SpO2 91 % 10/31/22 1531  Vitals shown include unvalidated device data.  Last Pain:  Vitals:   10/31/22 1229  TempSrc: Oral  PainSc: 2          Complications: No notable events documented.

## 2022-10-31 NOTE — Anesthesia Postprocedure Evaluation (Signed)
Anesthesia Post Note  Patient: Ashlee Robertson  Procedure(s) Performed: LAPAROSCOPIC CHOLECYSTECTOMY     Patient location during evaluation: PACU Anesthesia Type: General Level of consciousness: awake and alert Pain management: pain level controlled Vital Signs Assessment: post-procedure vital signs reviewed and stable Respiratory status: spontaneous breathing, nonlabored ventilation, respiratory function stable and patient connected to nasal cannula oxygen Cardiovascular status: blood pressure returned to baseline and stable Postop Assessment: no apparent nausea or vomiting Anesthetic complications: no  No notable events documented.  Last Vitals:  Vitals:   10/31/22 1615 10/31/22 1630  BP: 134/65 133/68  Pulse: 80 74  Resp: 15 15  Temp:  36.6 C  SpO2: 92% 93%    Last Pain:  Vitals:   10/31/22 1630  TempSrc:   PainSc: 3                  Trevor Iha

## 2022-10-31 NOTE — Discharge Summary (Signed)
[facility] Discharge Summary   Patient ID: Ashlee Robertson MRN: [id number] DOB/AGE: 1971-06-02 51 y.o.  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 RADIATION DOSE REDUCTION: This exam was performed according to the departmental dose-optimization program which includes automated exposure control, adjustment of the mA and/or kV according to patient size and/or use of iterative reconstruction technique. CONTRAST:  90mL OMNIPAQUE IOHEXOL 300 MG/ML  SOLN COMPARISON:  None Available. FINDINGS: Lower chest: No acute abnormality. Hepatobiliary: No focal liver abnormality. Calcified gallstones noted within the gallbladder lumen. Associated gallbladder wall thickening, pericholecystic fluid, pericholecystic fat stranding. No biliary dilatation. Pancreas: No focal lesion. Normal pancreatic contour. No surrounding inflammatory changes. No main pancreatic ductal dilatation. Spleen: Normal in size without focal abnormality. Adrenals/Urinary Tract: No adrenal nodule bilaterally. Bilateral kidneys enhance symmetrically. No hydronephrosis. No hydroureter. The urinary bladder is unremarkable. On delayed imaging, there is no urothelial wall thickening and there are no  filling defects in the opacified portions of the bilateral collecting systems or ureters. Stomach/Bowel: Stomach is within normal limits. No evidence of bowel wall thickening or dilatation. Appendix appears normal. Vascular/Lymphatic: No abdominal aorta or iliac aneurysm. No abdominal, pelvic, or inguinal lymphadenopathy. Reproductive: There is a 6.3 x 5.2 cm fluid density lesion along the rectouterine pouch arising from the left ovary. Uterus and bilateral adnexa are unremarkable. Other: Trace right upper quadrant free fluid. No intraperitoneal free gas. No organized fluid collection. Musculoskeletal: No chest wall abnormality. No suspicious lytic or blastic osseous lesions. No acute displaced fracture. IMPRESSION: 1. Cholelithiasis with acute cholecystitis. Recommend surgical consultation. 2. A 6.3 cm left ovarian cyst. If patient postmenopausal: Recommend follow-up US in 6-12 months. Note: This recommendation does not apply to premenarchal patients and to those with increased risk (genetic, family history, elevated tumor markers or other high-risk factors) of ovarian cancer. Reference: JACR 2020 Feb; 17(2):248-254 Electronically Signed   By: Tish Frederickson M.D.   On: 10/30/2022 17:49    Procedures Dr. Derrell Lolling (10/31/2022) - Laparoscopic Cholecystectomy   Hospital Course:  Ashlee Robertson is a 51 y.o. female with no known medical hx presented to [facility] with a 2-3 wk hx of vague MEG/RUQ pain - achy/gnawing, radiates to back. No aggrav/alleviating factors. Never had this kind of pain before. Denies any associated n/v. Has tried antacids without significant improvement. No fever/chills  Workup showed acute cholecystitis.  Patient was admitted and underwent procedure listed above.  Tolerated procedure well. On POD0 the patient was felt stable for discharge home.  Patient will follow up as below and knows to call with questions or concerns.   Of note, patient was incidentally found to have a 6.3cm left  ovarian cyst - she knows to follow up with her gynecologist regarding this.    Allergies as of 10/31/2022   No Known Allergies      Medication List  TAKE these medications    polyethylene glycol 17 g packet Commonly known as: MiraLax Take 17 g by mouth daily as needed for mild constipation.   traMADol 50 MG tablet Commonly known as: ULTRAM Take 1 tablet (50 mg total) by mouth every 6 (six) hours as needed for severe pain.   VITAMIN B-12 PO Take 1 tablet by mouth daily.   VITAMIN D-3 PO Take 1 tablet by mouth daily.          Follow-up Information     Marti Sleigh, MD. Call.   Specialties: Gynecology, Urology Why: call to arrange follow up regarding finding of left ovarian cyst on your CT scan Contact information: [place] [address] [facility], [place]. Go on 11/19/2022.   Specialty: General Surgery Why: Your appointment is 11/19/22 at 11:30 am Arrive 30min early to check in, fill out paperwork, Bring photo ID and insurance information Contact information: [address] [place] [phone]                 Signed: Wellington Hampshire, [facility] 10/31/2022, 4:25 PM Please see Amion for pager number during day hours 7:00am-4:30pm

## 2022-11-01 ENCOUNTER — Encounter (HOSPITAL_COMMUNITY): Payer: Self-pay | Admitting: General Surgery

## 2022-11-04 LAB — SURGICAL PATHOLOGY

## 2022-11-19 DIAGNOSIS — G8918 Other acute postprocedural pain: Secondary | ICD-10-CM | POA: Diagnosis not present

## 2022-11-20 ENCOUNTER — Observation Stay: Admit: 2022-11-20 | Payer: Self-pay

## 2022-11-20 ENCOUNTER — Ambulatory Visit (HOSPITAL_COMMUNITY)
Admission: RE | Admit: 2022-11-20 | Discharge: 2022-11-20 | Disposition: A | Discharge status: Home / Self Care | Payer: BC Managed Care – PPO | Source: Ambulatory Visit | Attending: Student | Admitting: Student

## 2022-11-20 ENCOUNTER — Encounter (HOSPITAL_COMMUNITY): Payer: Self-pay

## 2022-11-20 ENCOUNTER — Other Ambulatory Visit (HOSPITAL_COMMUNITY): Payer: Self-pay | Admitting: Student

## 2022-11-20 DIAGNOSIS — Z9049 Acquired absence of other specified parts of digestive tract: Secondary | ICD-10-CM | POA: Diagnosis not present

## 2022-11-20 DIAGNOSIS — Z9889 Other specified postprocedural states: Secondary | ICD-10-CM | POA: Diagnosis not present

## 2022-11-20 DIAGNOSIS — K838 Other specified diseases of biliary tract: Secondary | ICD-10-CM | POA: Diagnosis not present

## 2022-11-20 DIAGNOSIS — R112 Nausea with vomiting, unspecified: Secondary | ICD-10-CM | POA: Diagnosis not present

## 2022-11-20 DIAGNOSIS — R935 Abnormal findings on diagnostic imaging of other abdominal regions, including retroperitoneum: Secondary | ICD-10-CM | POA: Diagnosis not present

## 2022-11-20 MED ORDER — GADOBUTROL 1 MMOL/ML IV SOLN
8.5000 mL | Freq: Once | INTRAVENOUS | Status: AC | PRN
  Administered 2022-11-20: 8.5 mL via INTRAVENOUS

## 2022-11-21 ENCOUNTER — Encounter: Payer: Self-pay | Admitting: Surgery

## 2022-11-21 ENCOUNTER — Inpatient Hospital Stay: Admit: 2022-11-21 | Payer: Self-pay | Admitting: Surgery

## 2022-11-21 ENCOUNTER — Other Ambulatory Visit: Payer: Self-pay | Admitting: General Surgery

## 2022-11-21 ENCOUNTER — Encounter (HOSPITAL_COMMUNITY): Payer: Self-pay

## 2022-11-21 NOTE — H&P (Incomplete)
Admission Note  Ashlee Robertson 11/22/1971  [id number].    Chief Complaint/Reason for Consult: common bile duct stone  HPI:  51 y.o. female with medical history significant for laparoscopic cholecystectomy on 10/31/2022 by Dr. Derrell Lolling for acute cholecystitis who is being directly admitted to general surgery service at [facility] for common bile duct stone.  She was discharged on the date of her operation without issues. She followed up in our office on 11/19/2022 with complaints of yellowing of her skin that began about 1 week after surgery as well as yellowing of her sclera, itching, right upper quadrant pain, dark urine and pale stools. An MRCP was ordered and completed on 12/20 with findings of a common bile duct stone. Patient care plan discussed with Dr. Derrell Lolling and GI consulted for ERCP.  Currently ***. Last bowel movement ***  ROS: Review of Systems  All other systems reviewed and are negative.   No family history on file.  Past Medical History:  Diagnosis Date   Varicose veins     Past Surgical History:  Procedure Laterality Date   ABLATION     uterine   CHOLECYSTECTOMY N/A 10/31/2022   Procedure: LAPAROSCOPIC CHOLECYSTECTOMY;  Surgeon: Axel Filler, MD;  Location: [facility] OR;  Service: General;  Laterality: N/A;   CYSTOSCOPY     laproscopy     ectopic preg   sacrospinous hysteropexy     anterior aproach    Social History:  reports that she has never smoked. She has never used smokeless tobacco. She reports that she does not drink alcohol and does not use drugs.  Allergies: No Known Allergies  No medications prior to admission.    There were no vitals taken for this visit. Physical Exam: General: pleasant, WD, female who is laying in bed in NAD*** HEENT: head is normocephalic, atraumatic.  Sclera are noninjected but scleral icterus present***.  Pupils equal and round. EOMs intact.  Ears and nose without any masses or lesions.  Mouth is pink and  moist Heart: regular, rate, and rhythm.  Normal s1,s2. No obvious murmurs, gallops, or rubs noted.  Palpable radial and pedal pulses bilaterally Lungs: CTAB, no wheezes, rhonchi, or rales noted.  Respiratory effort nonlabored Abd: ***soft, ND, +BS, mild TTP in RUQ without rebound or guarding MSK: all 4 extremities are symmetrical with no cyanosis, clubbing, or edema. Skin: warm and dry with no masses, lesions, or rashes Neuro: Cranial nerves 2-12 grossly intact, sensation is normal throughout Psych: A&Ox3 with an appropriate affect.    No results found for this or any previous visit (from the past 48 hour(s)). MR ABDOMEN MRCP W WO CONTAST  Result Date: 11/20/2022 CLINICAL DATA:  Nausea/vomiting, status post lap cholecystectomy EXAM: MRI ABDOMEN WITHOUT AND WITH CONTRAST (INCLUDING MRCP) TECHNIQUE: Multiplanar multisequence MR imaging of the abdomen was performed both before and after the administration of intravenous contrast. Heavily T2-weighted images of the biliary and pancreatic ducts were obtained, and three-dimensional MRCP images were rendered by post processing. CONTRAST:  8.66mL GADAVIST GADOBUTROL 1 MMOL/ML IV SOLN COMPARISON:  CT abdomen/pelvis dated 10/30/2022 FINDINGS: Lower chest: Lung bases are clear. Hepatobiliary: Liver is within normal limits. No suspicious/enhancing hepatic lesions. No hepatic steatosis. Status post cholecystectomy. No fluid collection in the gallbladder fossa. Mild central intrahepatic ductal dilatation. Dilated common duct, measuring up to 11 mm. Associated 6 mm distal CBD stone at the ampulla (series 5/image 15). Pancreas:  Within normal limits. Spleen:  Within normal limits. Adrenals/Urinary Tract:  Adrenal  glands are within normal limits. Kidneys are within normal limits.  No hydronephrosis. Stomach/Bowel: Stomach is within normal limits. Visualized bowel is unremarkable. Vascular/Lymphatic:  No evidence of abdominal aortic aneurysm. No suspicious abdominal  lymphadenopathy. Other:  No abdominal ascites. Musculoskeletal: No focal osseous lesions. IMPRESSION: Status post cholecystectomy. Mild intrahepatic and extrahepatic ductal dilatation. Common duct measures 11 mm. Associated 6 mm distal CBD stone at the ampulla. Consider ERCP. These results will be called to the ordering clinician or representative by the Radiologist Assistant, and communication documented in the PACS or [employer]. Electronically Signed   By: Charline Bills M.D.   On: 11/20/2022 22:55   MR 3D Recon At Scanner  Result Date: 11/20/2022 CLINICAL DATA:  Nausea/vomiting, status post lap cholecystectomy EXAM: MRI ABDOMEN WITHOUT AND WITH CONTRAST (INCLUDING MRCP) TECHNIQUE: Multiplanar multisequence MR imaging of the abdomen was performed both before and after the administration of intravenous contrast. Heavily T2-weighted images of the biliary and pancreatic ducts were obtained, and three-dimensional MRCP images were rendered by post processing. CONTRAST:  8.55mL GADAVIST GADOBUTROL 1 MMOL/ML IV SOLN COMPARISON:  CT abdomen/pelvis dated 10/30/2022 FINDINGS: Lower chest: Lung bases are clear. Hepatobiliary: Liver is within normal limits. No suspicious/enhancing hepatic lesions. No hepatic steatosis. Status post cholecystectomy. No fluid collection in the gallbladder fossa. Mild central intrahepatic ductal dilatation. Dilated common duct, measuring up to 11 mm. Associated 6 mm distal CBD stone at the ampulla (series 5/image 15). Pancreas:  Within normal limits. Spleen:  Within normal limits. Adrenals/Urinary Tract:  Adrenal glands are within normal limits. Kidneys are within normal limits.  No hydronephrosis. Stomach/Bowel: Stomach is within normal limits. Visualized bowel is unremarkable. Vascular/Lymphatic:  No evidence of abdominal aortic aneurysm. No suspicious abdominal lymphadenopathy. Other:  No abdominal ascites. Musculoskeletal: No focal osseous lesions. IMPRESSION: Status post  cholecystectomy. Mild intrahepatic and extrahepatic ductal dilatation. Common duct measures 11 mm. Associated 6 mm distal CBD stone at the ampulla. Consider ERCP. These results will be called to the ordering clinician or representative by the Radiologist Assistant, and communication documented in the PACS or [employer]. Electronically Signed   By: Charline Bills M.D.   On: 11/20/2022 22:55      Assessment/Plan Choledocholithiasis s/p laparoscopic cholecystectomy on 10/31/22 by Dr. Derrell Lolling for acute cholecystitis MRCP 12/20 with CBD stone and patient clinically jaundiced/obstructed. Labs pending. She is not septic appearing*** Admit for IV fluids, pain control and GI consult. Case discussed with Dr. Dulce Sellar with [facility] who will evaluate for ERCP   FEN: NPO awaiting GI consult ID: none VTE: [id number]   Eric Form, [facility] 11/21/2022, 11:56 AM Please see Amion for pager number during day hours 7:00am-4:30pm

## 2022-11-22 ENCOUNTER — Other Ambulatory Visit: Payer: Self-pay

## 2022-11-22 ENCOUNTER — Ambulatory Visit (HOSPITAL_COMMUNITY): Payer: BC Managed Care – PPO | Admitting: Certified Registered Nurse Anesthetist

## 2022-11-22 ENCOUNTER — Encounter (HOSPITAL_COMMUNITY)
Admission: RE | Disposition: A | Discharge status: Home / Self Care | Payer: Self-pay | Source: Ambulatory Visit | Attending: Gastroenterology

## 2022-11-22 ENCOUNTER — Encounter (HOSPITAL_COMMUNITY): Payer: Self-pay | Admitting: Gastroenterology

## 2022-11-22 ENCOUNTER — Other Ambulatory Visit: Payer: Self-pay | Admitting: Gastroenterology

## 2022-11-22 ENCOUNTER — Ambulatory Visit (HOSPITAL_COMMUNITY)
Admission: RE | Admit: 2022-11-22 | Discharge: 2022-11-22 | Disposition: A | Discharge status: Home / Self Care | Payer: BC Managed Care – PPO | Source: Ambulatory Visit | Attending: Gastroenterology | Admitting: Gastroenterology

## 2022-11-22 ENCOUNTER — Ambulatory Visit (HOSPITAL_COMMUNITY): Payer: BC Managed Care – PPO

## 2022-11-22 DIAGNOSIS — Z9049 Acquired absence of other specified parts of digestive tract: Secondary | ICD-10-CM | POA: Diagnosis not present

## 2022-11-22 DIAGNOSIS — K8050 Calculus of bile duct without cholangitis or cholecystitis without obstruction: Secondary | ICD-10-CM | POA: Diagnosis not present

## 2022-11-22 DIAGNOSIS — N83202 Unspecified ovarian cyst, left side: Secondary | ICD-10-CM | POA: Diagnosis not present

## 2022-11-22 DIAGNOSIS — K449 Diaphragmatic hernia without obstruction or gangrene: Secondary | ICD-10-CM | POA: Diagnosis not present

## 2022-11-22 DIAGNOSIS — R748 Abnormal levels of other serum enzymes: Secondary | ICD-10-CM | POA: Diagnosis not present

## 2022-11-22 DIAGNOSIS — R109 Unspecified abdominal pain: Secondary | ICD-10-CM | POA: Diagnosis not present

## 2022-11-22 DIAGNOSIS — K760 Fatty (change of) liver, not elsewhere classified: Secondary | ICD-10-CM | POA: Diagnosis not present

## 2022-11-22 DIAGNOSIS — R160 Hepatomegaly, not elsewhere classified: Secondary | ICD-10-CM | POA: Diagnosis not present

## 2022-11-22 DIAGNOSIS — K838 Other specified diseases of biliary tract: Secondary | ICD-10-CM | POA: Diagnosis not present

## 2022-11-22 DIAGNOSIS — K219 Gastro-esophageal reflux disease without esophagitis: Secondary | ICD-10-CM | POA: Diagnosis not present

## 2022-11-22 DIAGNOSIS — R7401 Elevation of levels of liver transaminase levels: Secondary | ICD-10-CM | POA: Diagnosis not present

## 2022-11-22 DIAGNOSIS — N949 Unspecified condition associated with female genital organs and menstrual cycle: Secondary | ICD-10-CM | POA: Diagnosis not present

## 2022-11-22 DIAGNOSIS — E8809 Other disorders of plasma-protein metabolism, not elsewhere classified: Secondary | ICD-10-CM | POA: Diagnosis not present

## 2022-11-22 DIAGNOSIS — E46 Unspecified protein-calorie malnutrition: Secondary | ICD-10-CM | POA: Diagnosis not present

## 2022-11-22 DIAGNOSIS — R1011 Right upper quadrant pain: Secondary | ICD-10-CM | POA: Diagnosis not present

## 2022-11-22 DIAGNOSIS — D509 Iron deficiency anemia, unspecified: Secondary | ICD-10-CM | POA: Diagnosis not present

## 2022-11-22 DIAGNOSIS — Z6830 Body mass index (BMI) 30.0-30.9, adult: Secondary | ICD-10-CM | POA: Diagnosis not present

## 2022-11-22 DIAGNOSIS — R1012 Left upper quadrant pain: Secondary | ICD-10-CM | POA: Diagnosis not present

## 2022-11-22 HISTORY — PX: REMOVAL OF STONES: SHX5545

## 2022-11-22 HISTORY — PX: SPHINCTEROTOMY: SHX5544

## 2022-11-22 HISTORY — PX: ERCP: SHX5425

## 2022-11-22 SURGERY — ERCP, WITH INTERVENTION IF INDICATED
Anesthesia: General

## 2022-11-22 SURGERY — ERCP, WITH INTERVENTION IF INDICATED
Anesthesia: General | Laterality: Bilateral

## 2022-11-22 MED ORDER — MIDAZOLAM HCL 2 MG/2ML IJ SOLN
INTRAMUSCULAR | Status: DC | PRN
  Administered 2022-11-22: 2 mg via INTRAVENOUS

## 2022-11-22 MED ORDER — ROCURONIUM BROMIDE 10 MG/ML (PF) SYRINGE
PREFILLED_SYRINGE | INTRAVENOUS | Status: DC | PRN
  Administered 2022-11-22: 50 mg via INTRAVENOUS

## 2022-11-22 MED ORDER — DICLOFENAC SUPPOSITORY 100 MG
RECTAL | Status: AC
  Filled 2022-11-22: qty 1

## 2022-11-22 MED ORDER — SODIUM CHLORIDE 0.9 % IV SOLN
INTRAVENOUS | Status: DC | PRN
  Administered 2022-11-22: 18 mL

## 2022-11-22 MED ORDER — PROPOFOL 10 MG/ML IV BOLUS
INTRAVENOUS | Status: DC | PRN
  Administered 2022-11-22: 20 mg via INTRAVENOUS
  Administered 2022-11-22: 150 mg via INTRAVENOUS

## 2022-11-22 MED ORDER — LIDOCAINE 2% (20 MG/ML) 5 ML SYRINGE
INTRAMUSCULAR | Status: DC | PRN
  Administered 2022-11-22: 60 mg via INTRAVENOUS

## 2022-11-22 MED ORDER — DICLOFENAC SUPPOSITORY 100 MG
RECTAL | Status: DC | PRN
  Administered 2022-11-22: 100 mg via RECTAL

## 2022-11-22 MED ORDER — INDOMETHACIN 50 MG RE SUPP
RECTAL | Status: AC
  Filled 2022-11-22: qty 2

## 2022-11-22 MED ORDER — ONDANSETRON HCL 4 MG/2ML IJ SOLN
INTRAMUSCULAR | Status: DC | PRN
  Administered 2022-11-22: 4 mg via INTRAVENOUS

## 2022-11-22 MED ORDER — FENTANYL CITRATE (PF) 100 MCG/2ML IJ SOLN
INTRAMUSCULAR | Status: DC | PRN
  Administered 2022-11-22 (×2): 50 ug via INTRAVENOUS

## 2022-11-22 MED ORDER — DEXAMETHASONE SODIUM PHOSPHATE 10 MG/ML IJ SOLN
INTRAMUSCULAR | Status: DC | PRN
  Administered 2022-11-22: 5 mg via INTRAVENOUS

## 2022-11-22 MED ORDER — FENTANYL CITRATE (PF) 100 MCG/2ML IJ SOLN: 25.0000 ug | INTRAMUSCULAR | Status: DC | PRN

## 2022-11-22 MED ORDER — CIPROFLOXACIN IN D5W 400 MG/200ML IV SOLN
INTRAVENOUS | Status: DC | PRN
  Administered 2022-11-22: 400 mg via INTRAVENOUS

## 2022-11-22 MED ORDER — AMISULPRIDE (ANTIEMETIC) 5 MG/2ML IV SOLN: 10.0000 mg | Freq: Once | INTRAVENOUS | Status: DC | PRN

## 2022-11-22 MED ORDER — CIPROFLOXACIN IN D5W 400 MG/200ML IV SOLN
INTRAVENOUS | Status: AC
  Filled 2022-11-22: qty 200

## 2022-11-22 MED ORDER — LACTATED RINGERS IV SOLN: INTRAVENOUS | Status: DC | PRN

## 2022-11-22 MED ORDER — SUGAMMADEX SODIUM 200 MG/2ML IV SOLN
INTRAVENOUS | Status: DC | PRN
  Administered 2022-11-22: 200 mg via INTRAVENOUS

## 2022-11-22 MED ORDER — PHENYLEPHRINE 80 MCG/ML (10ML) SYRINGE FOR IV PUSH (FOR BLOOD PRESSURE SUPPORT)
PREFILLED_SYRINGE | INTRAVENOUS | Status: DC | PRN
  Administered 2022-11-22 (×2): 80 ug via INTRAVENOUS

## 2022-11-22 NOTE — Op Note (Signed)
[facility] Patient Name: Ashlee Robertson Procedure Date : 11/22/2022 MRN: [id number] Attending MD: Vida RiggerMarc Avenell Sellers , MD, [phone] Date of Birth: 05/23/1971 CSN: [id number] Age: 51 Admit Type: Outpatient Procedure:                ERCP Indications:              For therapy of bile duct stone(s) Providers:                Vida RiggerMarc Damien Batty, MD, Margaree MackintoshHayleigh Westmoreland, RN, Harrington ChallengerHope                            Parker, Technician Referring MD:              Medicines:                General Anesthesia Complications:            No immediate complications. Estimated Blood Loss:     Estimated blood loss: none. Procedure:                Pre-Anesthesia Assessment:                           - Prior to the procedure, a History and Physical                            was performed, and patient medications and                            allergies were reviewed. The patient's tolerance of                            previous anesthesia was also reviewed. The risks                            and benefits of the procedure and the sedation                            options and risks were discussed with the patient.                            All questions were answered, and informed consent                            was obtained. Prior Anticoagulants: The patient has                            taken no anticoagulant or antiplatelet agents. ASA                            Grade Assessment: II - A patient with mild systemic                            disease. After reviewing the risks and benefits,  the patient was deemed in satisfactory condition to                            undergo the procedure.                           After obtaining informed consent, the scope was                            passed under direct vision. Throughout the                            procedure, the patient's blood pressure, pulse, and                            oxygen saturations were monitored  continuously. The                            [id number]-[id number] ([id number] Olympus duodenoscope was                            introduced through the mouth, and used to inject                            contrast into and used to cannulate the bile duct.                            The ERCP was accomplished without difficulty. The                            patient tolerated the procedure well. Scope In: Scope Out: Findings:      The major papilla was slightly bulging. Deep selective cannulation was       readily obtained and the wire was advanced into the intrahepatic and an       obvious stone was seen on initial cholangiogram and a biliary       sphincterotomy was made with a Hydratome sphincterotome using ERBE       electrocautery. There was no post-sphincterotomy bleeding. We proceeded       with the sphincterotomy until we had adequate biliary drainage and could       get the fully bowed sphincterotome easily in and out of the duct and to       discover objects, the biliary tree was swept with a 12 mm balloon       starting at the bifurcation. All stones were removed. Nothing was found       on multiple subsequent went balloon pull-through as well as on occlusion       cholangiogram done in the customary fashion and there was adequate       biliary drainage and there was no residual filling defect at the end of       the procedure and throughout the procedure there was no pancreatic duct       injection or wire advancement the patient tolerated the procedure well. Impression:               - The major papilla  appeared to be bulging.                           - Choledocholithiasis was found. Complete removal                            was accomplished by biliary sphincterotomy and                            balloon extraction.                           - A biliary sphincterotomy was performed.                           - The biliary tree was swept and nothing was found                             at the of the procedure. Recommendation:           - Clear liquid diet for 6 hours. If doing well this                            evening at 6 PM may have soft solids                           - Continue present medications.                           - Return to GI clinic in 3 months to discuss                            screening colonoscopy.                           - Telephone GI clinic if symptomatic PRN.                           - Check liver enzymes (AST, ALT, alkaline                            phosphatase, bilirubin) in 1 week in our office                            which is 1 floor below the surgical office same                            building. Procedure Code(s):        --- Professional ---                           [id number], Endoscopic retrograde                            cholangiopancreatography (ERCP); with removal of  calculi/debris from biliary/pancreatic duct(s)                           [phone], Endoscopic retrograde                            cholangiopancreatography (ERCP); with                            sphincterotomy/papillotomy Diagnosis Code(s):        --- Professional ---                           K80.50, Calculus of bile duct without cholangitis                            or cholecystitis without obstruction                           K83.8, Other specified diseases of biliary tract CPT copyright 2022 American [employer] Association. All rights reserved. The codes documented in this report are preliminary and upon coder review may  be revised to meet current compliance requirements. Vida Rigger, MD 11/22/2022 11:59:19 AM This report has been signed electronically. Number of Addenda: 0

## 2022-11-22 NOTE — Progress Notes (Addendum)
Ashlee Robertson 10:59 AM  Subjective: Patient seen and examined and case discussed with my partner Ashlee Robertson and her hospital computer chart reviewed she has had more jaundice and itching after her cholecystectomy and dark urine then she really has pain or nausea and she has not had any other GI issues or procedures and she is not on any aspirin or blood thinners and we reviewed his and her surgical history and answered all of her questions  Objective: Signs stable afebrile no acute distress exam please see preassessment evaluation labs reviewed on care everywhere and MRI reviewed  Assessment: CBD stone  Plan: The risk benefits methods and success rate of ERCP and hopefully stone extraction was discussed we will proceed this morning with anesthesia assistance with further workup and plans pending those findings  [facility]  office [phone] After 5PM or if no answer call [phone]

## 2022-11-22 NOTE — Transfer of Care (Signed)
Immediate Anesthesia Transfer of Care Note  Patient: Ashlee Robertson  Procedure(s) Performed: ENDOSCOPIC RETROGRADE CHOLANGIOPANCREATOGRAPHY (ERCP)  Patient Location: Endoscopy Unit  Anesthesia Type:General  Level of Consciousness: awake, alert , and oriented  Airway & Oxygen Therapy: Patient Spontanous Breathing  Post-op Assessment: Report given to RN, Post -op Vital signs reviewed and stable, and Patient moving all extremities X 4  Post vital signs: Reviewed and stable  Last Vitals:  Vitals Value Taken Time  BP 117/79   Temp    Pulse 85   Resp 14   SpO2 98     Last Pain:  Vitals:   11/22/22 1019  TempSrc: Temporal  PainSc: 0-No pain         Complications: No notable events documented.

## 2022-11-22 NOTE — Anesthesia Procedure Notes (Signed)
Procedure Name: Intubation Date/Time: 11/22/2022 11:09 AM  Performed by: Nils Pyle, CRNAPre-anesthesia Checklist: Patient identified, Emergency Drugs available, Suction available and Patient being monitored Patient Re-evaluated:Patient Re-evaluated prior to induction Oxygen Delivery Method: Circle system utilized Preoxygenation: Pre-oxygenation with 100% oxygen Induction Type: IV induction Ventilation: Mask ventilation without difficulty Laryngoscope Size: Miller and 2 Grade View: Grade I Tube type: Oral Tube size: 7.0 mm Number of attempts: 1 Airway Equipment and Method: Stylet Placement Confirmation: ETT inserted through vocal cords under direct vision, positive ETCO2 and breath sounds checked- equal and bilateral Secured at: 22 cm Tube secured with: Tape Dental Injury: Teeth and Oropharynx as per pre-operative assessment

## 2022-11-22 NOTE — Discharge Instructions (Addendum)
Clear liquid diet only until 6 PM and if doing well at that point may slowly advance diet to soft solids and tomorrow if doing well may slowly advance diet and I prefer Tylenol to ibuprofen or Advil or Aleve for 3 to 5 days if needed and call if GI question or problem otherwise follow-up lab work next week in our office on Thursday and our office is on the second floor of the same building as the surgeons and follow-up with me as needed or in 3 months to discuss screening colonoscopy    YOU HAD AN ENDOSCOPIC PROCEDURE TODAY: Refer to the procedure report and other information in the discharge instructions given to you for any specific questions about what was found during the examination. If this information does not answer your questions, please call [facility] office at [phone] to clarify.   YOU SHOULD EXPECT: Some feelings of bloating in the abdomen. Passage of more gas than usual. Walking can help get rid of the air that was put into your GI tract during the procedure and reduce the bloating. If you had a lower endoscopy (such as a colonoscopy or flexible sigmoidoscopy) you may notice spotting of blood in your stool or on the toilet paper. Some abdominal soreness may be present for a day or two, also.  DIET: Your first meal following the procedure should be a light meal and then it is ok to progress to your normal diet. A half-sandwich or bowl of soup is an example of a good first meal. Heavy or fried foods are harder to digest and may make you feel nauseous or bloated. Drink plenty of fluids but you should avoid alcoholic beverages for 24 hours. If you had a esophageal dilation, please see attached instructions for diet.    ACTIVITY: Your care partner should take you home directly after the procedure. You should plan to take it easy, moving slowly for the rest of the day. You can resume normal activity the day after the procedure however YOU SHOULD NOT DRIVE, use power tools, machinery or perform  tasks that involve climbing or major physical exertion for 24 hours (because of the sedation medicines used during the test).   SYMPTOMS TO REPORT IMMEDIATELY: A gastroenterologist can be reached at any hour. Please call [phone]  for any of the following symptoms:  Following lower endoscopy (colonoscopy, flexible sigmoidoscopy) Excessive amounts of blood in the stool  Significant tenderness, worsening of abdominal pains  Swelling of the abdomen that is new, acute  Fever of 100 or higher  Following upper endoscopy (EGD, EUS, ERCP, esophageal dilation) Vomiting of blood or coffee ground material  New, significant abdominal pain  New, significant chest pain or pain under the shoulder blades  Painful or persistently difficult swallowing  New shortness of breath  Black, tarry-looking or red, bloody stools  FOLLOW UP:  If any biopsies were taken you will be contacted by phone or by letter within the next 1-3 weeks. Call [phone]  if you have not heard about the biopsies in 3 weeks.  Please also call with any specific questions about appointments or follow up tests. YOU HAD AN ENDOSCOPIC PROCEDURE TODAY: Refer to the procedure report and other information in the discharge instructions given to you for any specific questions about what was found during the examination. If this information does not answer your questions, please call [facility] office at [phone] to clarify.   YOU SHOULD EXPECT: Some feelings of bloating in the abdomen. Passage of  more gas than usual. Walking can help get rid of the air that was put into your GI tract during the procedure and reduce the bloating. If you had a lower endoscopy (such as a colonoscopy or flexible sigmoidoscopy) you may notice spotting of blood in your stool or on the toilet paper. Some abdominal soreness may be present for a day or two, also.  DIET: Your first meal following the procedure should be a light meal and then it is ok to progress to  your normal diet. A half-sandwich or bowl of soup is an example of a good first meal. Heavy or fried foods are harder to digest and may make you feel nauseous or bloated. Drink plenty of fluids but you should avoid alcoholic beverages for 24 hours. If you had a esophageal dilation, please see attached instructions for diet.    ACTIVITY: Your care partner should take you home directly after the procedure. You should plan to take it easy, moving slowly for the rest of the day. You can resume normal activity the day after the procedure however YOU SHOULD NOT DRIVE, use power tools, machinery or perform tasks that involve climbing or major physical exertion for 24 hours (because of the sedation medicines used during the test).   SYMPTOMS TO REPORT IMMEDIATELY: A gastroenterologist can be reached at any hour. Please call [phone]  for any of the following symptoms:  Following lower endoscopy (colonoscopy, flexible sigmoidoscopy) Excessive amounts of blood in the stool  Significant tenderness, worsening of abdominal pains  Swelling of the abdomen that is new, acute  Fever of 100 or higher  Following upper endoscopy (EGD, EUS, ERCP, esophageal dilation) Vomiting of blood or coffee ground material  New, significant abdominal pain  New, significant chest pain or pain under the shoulder blades  Painful or persistently difficult swallowing  New shortness of breath  Black, tarry-looking or red, bloody stools  FOLLOW UP:  If any biopsies were taken you will be contacted by phone or by letter within the next 1-3 weeks. Call [phone]  if you have not heard about the biopsies in 3 weeks.  Please also call with any specific questions about appointments or follow up tests.YOU HAD AN ENDOSCOPIC PROCEDURE TODAY: Refer to the procedure report and other information in the discharge instructions given to you for any specific questions about what was found during the examination. If this information does not  answer your questions, please call [facility] office at [phone] to clarify.   YOU SHOULD EXPECT: Some feelings of bloating in the abdomen. Passage of more gas than usual. Walking can help get rid of the air that was put into your GI tract during the procedure and reduce the bloating. If you had a lower endoscopy (such as a colonoscopy or flexible sigmoidoscopy) you may notice spotting of blood in your stool or on the toilet paper. Some abdominal soreness may be present for a day or two, also.  DIET: Your first meal following the procedure should be a light meal and then it is ok to progress to your normal diet. A half-sandwich or bowl of soup is an example of a good first meal. Heavy or fried foods are harder to digest and may make you feel nauseous or bloated. Drink plenty of fluids but you should avoid alcoholic beverages for 24 hours. If you had a esophageal dilation, please see attached instructions for diet.    ACTIVITY: Your care partner should take you home directly after the procedure. You  should plan to take it easy, moving slowly for the rest of the day. You can resume normal activity the day after the procedure however YOU SHOULD NOT DRIVE, use power tools, machinery or perform tasks that involve climbing or major physical exertion for 24 hours (because of the sedation medicines used during the test).   SYMPTOMS TO REPORT IMMEDIATELY: A gastroenterologist can be reached at any hour. Please call [phone]  for any of the following symptoms:  Following lower endoscopy (colonoscopy, flexible sigmoidoscopy) Excessive amounts of blood in the stool  Significant tenderness, worsening of abdominal pains  Swelling of the abdomen that is new, acute  Fever of 100 or higher  Following upper endoscopy (EGD, EUS, ERCP, esophageal dilation) Vomiting of blood or coffee ground material  New, significant abdominal pain  New, significant chest pain or pain under the shoulder blades  Painful or  persistently difficult swallowing  New shortness of breath  Black, tarry-looking or red, bloody stools  FOLLOW UP:  If any biopsies were taken you will be contacted by phone or by letter within the next 1-3 weeks. Call [phone]  if you have not heard about the biopsies in 3 weeks.  Please also call with any specific questions about appointments or follow up tests. YOU HAD AN ENDOSCOPIC PROCEDURE TODAY: Refer to the procedure report and other information in the discharge instructions given to you for any specific questions about what was found during the examination. If this information does not answer your questions, please call [facility] office at [phone] to clarify.   YOU SHOULD EXPECT: Some feelings of bloating in the abdomen. Passage of more gas than usual. Walking can help get rid of the air that was put into your GI tract during the procedure and reduce the bloating. If you had a lower endoscopy (such as a colonoscopy or flexible sigmoidoscopy) you may notice spotting of blood in your stool or on the toilet paper. Some abdominal soreness may be present for a day or two, also.  DIET: Your first meal following the procedure should be a light meal and then it is ok to progress to your normal diet. A half-sandwich or bowl of soup is an example of a good first meal. Heavy or fried foods are harder to digest and may make you feel nauseous or bloated. Drink plenty of fluids but you should avoid alcoholic beverages for 24 hours. If you had a esophageal dilation, please see attached instructions for diet.    ACTIVITY: Your care partner should take you home directly after the procedure. You should plan to take it easy, moving slowly for the rest of the day. You can resume normal activity the day after the procedure however YOU SHOULD NOT DRIVE, use power tools, machinery or perform tasks that involve climbing or major physical exertion for 24 hours (because of the sedation medicines used during the  test).   SYMPTOMS TO REPORT IMMEDIATELY: A gastroenterologist can be reached at any hour. Please call [phone]  for any of the following symptoms:  Following lower endoscopy (colonoscopy, flexible sigmoidoscopy) Excessive amounts of blood in the stool  Significant tenderness, worsening of abdominal pains  Swelling of the abdomen that is new, acute  Fever of 100 or higher  Following upper endoscopy (EGD, EUS, ERCP, esophageal dilation) Vomiting of blood or coffee ground material  New, significant abdominal pain  New, significant chest pain or pain under the shoulder blades  Painful or persistently difficult swallowing  New shortness of breath  Black, tarry-looking or red, bloody stools  FOLLOW UP:  If any biopsies were taken you will be contacted by phone or by letter within the next 1-3 weeks. Call [phone]  if you have not heard about the biopsies in 3 weeks.  Please also call with any specific questions about appointments or follow up tests.

## 2022-11-22 NOTE — Anesthesia Preprocedure Evaluation (Signed)
Anesthesia Evaluation  Patient identified by MRN, date of birth, ID band Patient awake    Reviewed: Allergy & Precautions, NPO status , Patient's Chart, lab work & pertinent test results  Airway Mallampati: II  TM Distance: >3 FB Neck ROM: Full    Dental  (+) Dental Advisory Given   Pulmonary neg pulmonary ROS   breath sounds clear to auscultation       Cardiovascular negative cardio ROS  Rhythm:Regular Rate:Normal     Neuro/Psych negative neurological ROS     GI/Hepatic negative GI ROS, Neg liver ROS,,,  Endo/Other  negative endocrine ROS    Renal/GU negative Renal ROS     Musculoskeletal   Abdominal   Peds  Hematology negative hematology ROS (+)   Anesthesia Other Findings   Reproductive/Obstetrics                             Anesthesia Physical Anesthesia Plan  ASA: 2  Anesthesia Plan: General   Post-op Pain Management: Minimal or no pain anticipated   Induction: Intravenous  PONV Risk Score and Plan: 3 and Dexamethasone, Ondansetron and Treatment may vary due to age or medical condition  Airway Management Planned: Oral ETT  Additional Equipment: None  Intra-op Plan:   Post-operative Plan: Extubation in OR  Informed Consent: I have reviewed the patients History and Physical, chart, labs and discussed the procedure including the risks, benefits and alternatives for the proposed anesthesia with the patient or authorized representative who has indicated his/her understanding and acceptance.     Dental advisory given  Plan Discussed with:   Anesthesia Plan Comments:        Anesthesia Quick Evaluation

## 2022-11-22 NOTE — Anesthesia Postprocedure Evaluation (Signed)
Anesthesia Post Note  Patient: Ashlee Robertson  Procedure(s) Performed: ENDOSCOPIC RETROGRADE CHOLANGIOPANCREATOGRAPHY (ERCP) SPHINCTEROTOMY REMOVAL OF STONES     Patient location during evaluation: PACU Anesthesia Type: General Level of consciousness: awake and alert Pain management: pain level controlled Vital Signs Assessment: post-procedure vital signs reviewed and stable Respiratory status: spontaneous breathing, nonlabored ventilation, respiratory function stable and patient connected to nasal cannula oxygen Cardiovascular status: blood pressure returned to baseline and stable Postop Assessment: no apparent nausea or vomiting Anesthetic complications: no  No notable events documented.  Last Vitals:  Vitals:   11/22/22 1230 11/22/22 1240  BP: 121/70 120/64  Pulse: 76 75  Resp: 11 12  Temp:    SpO2: 98% 99%    Last Pain:  Vitals:   11/22/22 1240  TempSrc:   PainSc: 0-No pain                 Kennieth Rad

## 2022-11-25 ENCOUNTER — Inpatient Hospital Stay (HOSPITAL_COMMUNITY)
Admission: EM | Admit: 2022-11-25 | Discharge: 2022-11-27 | DRG: 392 | Disposition: A | Discharge status: Home / Self Care | Payer: BC Managed Care – PPO | Attending: Family Medicine | Admitting: Family Medicine

## 2022-11-25 ENCOUNTER — Emergency Department (HOSPITAL_COMMUNITY): Payer: BC Managed Care – PPO

## 2022-11-25 ENCOUNTER — Encounter (HOSPITAL_COMMUNITY): Payer: Self-pay

## 2022-11-25 DIAGNOSIS — R7401 Elevation of levels of liver transaminase levels: Secondary | ICD-10-CM | POA: Diagnosis not present

## 2022-11-25 DIAGNOSIS — Z6830 Body mass index (BMI) 30.0-30.9, adult: Secondary | ICD-10-CM | POA: Diagnosis not present

## 2022-11-25 DIAGNOSIS — R109 Unspecified abdominal pain: Secondary | ICD-10-CM | POA: Diagnosis present

## 2022-11-25 DIAGNOSIS — K8050 Calculus of bile duct without cholangitis or cholecystitis without obstruction: Secondary | ICD-10-CM | POA: Diagnosis present

## 2022-11-25 DIAGNOSIS — R1012 Left upper quadrant pain: Principal | ICD-10-CM | POA: Diagnosis present

## 2022-11-25 DIAGNOSIS — K219 Gastro-esophageal reflux disease without esophagitis: Secondary | ICD-10-CM | POA: Diagnosis present

## 2022-11-25 DIAGNOSIS — K760 Fatty (change of) liver, not elsewhere classified: Secondary | ICD-10-CM | POA: Diagnosis present

## 2022-11-25 DIAGNOSIS — N949 Unspecified condition associated with female genital organs and menstrual cycle: Secondary | ICD-10-CM | POA: Insufficient documentation

## 2022-11-25 DIAGNOSIS — D509 Iron deficiency anemia, unspecified: Secondary | ICD-10-CM | POA: Diagnosis present

## 2022-11-25 DIAGNOSIS — E46 Unspecified protein-calorie malnutrition: Secondary | ICD-10-CM | POA: Diagnosis present

## 2022-11-25 DIAGNOSIS — E8809 Other disorders of plasma-protein metabolism, not elsewhere classified: Secondary | ICD-10-CM | POA: Diagnosis present

## 2022-11-25 LAB — CBC WITH DIFFERENTIAL/PLATELET
Abs Immature Granulocytes: 0.04 10*3/uL (ref 0.00–0.07)
Basophils Absolute: 0 10*3/uL (ref 0.0–0.1)
Basophils Relative: 0 %
Eosinophils Absolute: 0.2 10*3/uL (ref 0.0–0.5)
Eosinophils Relative: 2 %
HCT: 39.4 % (ref 36.0–46.0)
Hemoglobin: 12.9 g/dL (ref 12.0–15.0)
Immature Granulocytes: 1 %
Lymphocytes Relative: 18 %
Lymphs Abs: 1.3 10*3/uL (ref 0.7–4.0)
MCH: 29.9 pg (ref 26.0–34.0)
MCHC: 32.7 g/dL (ref 30.0–36.0)
MCV: 91.2 fL (ref 80.0–100.0)
Monocytes Absolute: 0.6 10*3/uL (ref 0.1–1.0)
Monocytes Relative: 7 %
Neutro Abs: 5.4 10*3/uL (ref 1.7–7.7)
Neutrophils Relative %: 72 %
Platelets: 366 10*3/uL (ref 150–400)
RBC: 4.32 MIL/uL (ref 3.87–5.11)
RDW: 15.6 % — ABNORMAL HIGH (ref 11.5–15.5)
WBC: 7.5 10*3/uL (ref 4.0–10.5)
nRBC: 0 % (ref 0.0–0.2)

## 2022-11-25 LAB — COMPREHENSIVE METABOLIC PANEL
ALT: 170 U/L — ABNORMAL HIGH (ref 0–44)
AST: 52 U/L — ABNORMAL HIGH (ref 15–41)
Albumin: 3.4 g/dL — ABNORMAL LOW (ref 3.5–5.0)
Alkaline Phosphatase: 295 U/L — ABNORMAL HIGH (ref 38–126)
Anion gap: 6 (ref 5–15)
BUN: 9 mg/dL (ref 6–20)
CO2: 28 mmol/L (ref 22–32)
Calcium: 8.9 mg/dL (ref 8.9–10.3)
Chloride: 104 mmol/L (ref 98–111)
Creatinine, Ser: 0.52 mg/dL (ref 0.44–1.00)
GFR, Estimated: >60 mL/min (ref >60)
Glucose, Bld: 94 mg/dL (ref 70–99)
Potassium: 3.7 mmol/L (ref 3.5–5.1)
Sodium: 138 mmol/L (ref 135–145)
Total Bilirubin: 1.4 mg/dL — ABNORMAL HIGH (ref 0.3–1.2)
Total Protein: 6.8 g/dL (ref 6.5–8.1)

## 2022-11-25 LAB — URINALYSIS, ROUTINE W REFLEX MICROSCOPIC
Bilirubin Urine: NEGATIVE
Glucose, UA: NEGATIVE mg/dL
Hgb urine dipstick: NEGATIVE
Ketones, ur: NEGATIVE mg/dL
Leukocytes,Ua: NEGATIVE
Nitrite: NEGATIVE
Protein, ur: NEGATIVE mg/dL
Specific Gravity, Urine: 1.006 (ref 1.005–1.030)
pH: 5 (ref 5.0–8.0)

## 2022-11-25 LAB — I-STAT BETA HCG BLOOD, ED (MC, WL, AP ONLY): I-stat hCG, quantitative: <5 m[IU]/mL (ref <5)

## 2022-11-25 LAB — LIPASE, BLOOD: Lipase: 35 U/L (ref 11–51)

## 2022-11-25 MED ORDER — SODIUM CHLORIDE 0.9 % IV SOLN: INTRAVENOUS | Status: AC

## 2022-11-25 MED ORDER — HYDROMORPHONE HCL 1 MG/ML IJ SOLN
1.0000 mg | Freq: Once | INTRAMUSCULAR | Status: AC
  Administered 2022-11-25: 1 mg via INTRAVENOUS
  Filled 2022-11-25: qty 1

## 2022-11-25 MED ORDER — LACTATED RINGERS IV BOLUS
1000.0000 mL | Freq: Once | INTRAVENOUS | Status: AC
  Administered 2022-11-25: 1000 mL via INTRAVENOUS

## 2022-11-25 MED ORDER — ONDANSETRON HCL 4 MG/2ML IJ SOLN
4.0000 mg | Freq: Once | INTRAMUSCULAR | Status: AC
  Administered 2022-11-25: 4 mg via INTRAVENOUS
  Filled 2022-11-25: qty 2

## 2022-11-25 MED ORDER — ONDANSETRON 4 MG PO TBDP
4.0000 mg | ORAL_TABLET | Freq: Once | ORAL | Status: AC
  Administered 2022-11-25: 4 mg via ORAL
  Filled 2022-11-25: qty 1

## 2022-11-25 MED ORDER — PANTOPRAZOLE SODIUM 40 MG PO TBEC
40.0000 mg | DELAYED_RELEASE_TABLET | Freq: Every day | ORAL | Status: DC
  Filled 2022-11-25: qty 1

## 2022-11-25 MED ORDER — HYDROMORPHONE HCL 1 MG/ML IJ SOLN
0.5000 mg | INTRAMUSCULAR | Status: DC | PRN
  Administered 2022-11-26 (×2): 0.5 mg via INTRAVENOUS
  Filled 2022-11-25 (×2): qty 0.5

## 2022-11-25 MED ORDER — FERROUS SULFATE 325 (65 FE) MG PO TABS
325.0000 mg | ORAL_TABLET | ORAL | Status: DC
  Filled 2022-11-25: qty 1

## 2022-11-25 MED ORDER — HEPARIN SODIUM (PORCINE) 5000 UNIT/ML IJ SOLN
5000.0000 [IU] | Freq: Three times a day (TID) | INTRAMUSCULAR | Status: DC
  Administered 2022-11-26 – 2022-11-27 (×4): 5000 [IU] via SUBCUTANEOUS
  Filled 2022-11-25 (×4): qty 1

## 2022-11-25 NOTE — H&P (Addendum)
History and Physical    Patient: Ashlee Robertson WJX:[id number] DOB: 1971-11-29 DOA: 11/25/2022 DOS: the patient was seen and examined on 11/26/2022 PCP: Daisy Floro, MD  Patient coming from: Home  Chief Complaint:  Chief Complaint  Patient presents with   Abdominal Pain   Flank Pain   HPI: Ashlee Robertson is a 51 y.o. female with medical history significant of postcholecystectomy about 1 month ago complicated by choledocholithiasis requiring ERCP on 12/22 who presents to the emergency department due to 1 day onset of left-sided abdominal pain.  Patient states that she was doing well after the ERCP, but on Sunday (12/24), while sitting in the front passenger seat in a car with husband, she turned slightly towards her husband to show him something on the phone and she felt a sharp pain in the left upper quadrant which wraps towards the middle of her back, pain was sharp, persistent and was rated as 8-9/10 on pain scale.  Pain worsens with movement, pain is alleviated with rest.  Tylenol taken at home did not alleviate the pain, so she decided to go to the ED for further evaluation and management.  She denies fever, chills, chest pain, shortness of breath, headache.  Patient denies history of alcohol use, tobacco use or any other recreational drug use.  ED Course:  In the emergency department, temperature was 97.5 F, respiratory rate 17/min, pulse 82 bpm, BP 118/52, O2 sat was 98%.  Workup in the ED showed normal CBC and BMP, albumin 3.4, AST 52, ALT 170, ALP 295, total bilirubin 1.4, lipase 35 Right upper quadrant abdominal ultrasound showed mildly enlarged and fatty liver.  Correlation with LFTs recommended to exclude steatohepatitis. CT abdomen and pelvis without contrast showed no CT etiology for acute abdominal pain identified Patient was treated with IV Dilaudid 1 mg x 3 and IV Zofran 4 mg x 3.  IV hydration was given. Gastroenterologist was consulted and will follow-up with patient  on consult in the morning per ED physician.  Hospitalist was asked to admit patient for further evaluation and management.  Review of Systems: Review of systems as noted in the HPI. All other systems reviewed and are negative.   Past Medical History:  Diagnosis Date   Varicose veins    Past Surgical History:  Procedure Laterality Date   ABLATION     uterine   CHOLECYSTECTOMY N/A 10/31/2022   Procedure: LAPAROSCOPIC CHOLECYSTECTOMY;  Surgeon: Axel Filler, MD;  Location: [facility] OR;  Service: General;  Laterality: N/A;   CYSTOSCOPY     ERCP N/A 11/22/2022   Procedure: ENDOSCOPIC RETROGRADE CHOLANGIOPANCREATOGRAPHY (ERCP);  Surgeon: Vida Rigger, MD;  Location: [facility] ENDOSCOPY;  Service: Gastroenterology;  Laterality: N/A;   laproscopy     ectopic preg   REMOVAL OF STONES  11/22/2022   Procedure: REMOVAL OF STONES;  Surgeon: Vida Rigger, MD;  Location: [facility] ENDOSCOPY;  Service: Gastroenterology;;   sacrospinous hysteropexy     anterior aproach   SPHINCTEROTOMY  11/22/2022   Procedure: SPHINCTEROTOMY;  Surgeon: Vida Rigger, MD;  Location: [facility] ENDOSCOPY;  Service: Gastroenterology;;    Social History:  reports that she has never smoked. She has never used smokeless tobacco. She reports that she does not drink alcohol and does not use drugs.   No Known Allergies  History reviewed. No pertinent family history.    Prior to Admission medications   Medication Sig Start Date End Date Taking? Authorizing Provider  esomeprazole (NEXIUM) 20 MG capsule Take 20 mg by mouth  daily.   Yes [provider]  ibuprofen (ADVIL) 200 MG tablet Take 400 mg by mouth every 8 (eight) hours as needed (pain.).   Yes [provider]    Physical Exam: BP (!) 129/53 (BP Location: Left Arm)   Pulse 86   Temp 98.4 F (36.9 C) (Oral)   Resp 16   Ht 5\' 5"  (1.651 m)   Wt 83.9 kg   SpO2 96%   BMI 30.79 kg/m   General: 51 y.o. year-old female well developed well nourished in no acute  distress.  Alert and oriented x3. HEENT: NCAT, EOMI Neck: Supple, trachea medial Cardiovascular: Regular rate and rhythm with no rubs or gallops.  No thyromegaly or JVD noted.  No lower extremity edema. 2/4 pulses in all 4 extremities. Respiratory: Clear to auscultation with no wheezes or rales. Good inspiratory effort. Abdomen: Soft, tender to palpation of left upper quadrant.  Positive left CVA tenderness  Muskuloskeletal: No cyanosis, clubbing or edema noted bilaterally Neuro: CN II-XII intact, strength 5/5 x 4, sensation, reflexes intact Skin: No ulcerative lesions noted or rashes Psychiatry: Judgement and insight appear normal. Mood is appropriate for condition and setting          Labs on Admission:  Basic Metabolic Panel: Recent Labs  Lab 11/25/22 1223  NA 138  K 3.7  CL 104  CO2 28  GLUCOSE 94  BUN 9  CREATININE 0.52  CALCIUM 8.9   Liver Function Tests: Recent Labs  Lab 11/25/22 1223  AST 52*  ALT 170*  ALKPHOS 295*  BILITOT 1.4*  PROT 6.8  ALBUMIN 3.4*   Recent Labs  Lab 11/25/22 1223  LIPASE 35   No results for input(s): "AMMONIA" in the last 168 hours. CBC: Recent Labs  Lab 11/25/22 1223  WBC 7.5  NEUTROABS 5.4  HGB 12.9  HCT 39.4  MCV 91.2  PLT 366   Cardiac Enzymes: No results for input(s): "CKTOTAL", "CKMB", "CKMBINDEX", "TROPONINI" in the last 168 hours.  BNP (last 3 results) No results for input(s): "BNP" in the last 8760 hours.  ProBNP (last 3 results) No results for input(s): "PROBNP" in the last 8760 hours.  CBG: No results for input(s): "GLUCAP" in the last 168 hours.  Radiological Exams on Admission: 11/27/22 Abdomen Limited RUQ (LIVER/GB)  Result Date: 11/25/2022 CLINICAL DATA:  Right upper quadrant abdominal pain. Cholecystectomy. EXAM: ULTRASOUND ABDOMEN LIMITED RIGHT UPPER QUADRANT COMPARISON:  None Available. FINDINGS: Gallbladder: Cholecystectomy. Common bile duct: Diameter: 2 mm Liver: There is diffuse increased liver  echogenicity most commonly seen in the setting of fatty infiltration. Superimposed inflammation or fibrosis is not excluded. Clinical correlation is recommended. The liver is enlarged measuring 17 cm in length. Portal vein is patent on color Doppler imaging with normal direction of blood flow towards the liver. Other: None. IMPRESSION: 1. Mildly enlarged and fatty liver. Correlation with LFTs recommended to exclude steatohepatitis. 2. Cholecystectomy. Electronically Signed   By: 11/27/2022 M.D.   On: 11/25/2022 21:25   CT Renal Stone Study  Result Date: 11/25/2022 CLINICAL DATA:  Abdominal/flank pain, stone suspected EXAM: CT ABDOMEN AND PELVIS WITHOUT CONTRAST TECHNIQUE: Multidetector CT imaging of the abdomen and pelvis was performed following the standard protocol without IV contrast. RADIATION DOSE REDUCTION: This exam was performed according to the departmental dose-optimization program which includes automated exposure control, adjustment of the mA and/or kV according to patient size and/or use of iterative reconstruction technique. COMPARISON:  October 30, 2022 FINDINGS: Evaluation is limited by lack of  IV contrast. Lower chest: No acute abnormality. Hepatobiliary: Status post interval cholecystectomy. Unremarkable noncontrast appearance of the liver. No definitive common bile duct dilation is noted but evaluation is limited by lack of IV contrast. Pancreas: No peripancreatic fat stranding. Spleen: Unremarkable. Adrenals/Urinary Tract: Adrenal glands are unremarkable. No hydronephrosis. No nephrolithiasis or obstructing nephrolithiasis. Bladder is unremarkable. Stomach/Bowel: No evidence of bowel obstruction. Appendix is normal. Small hiatal hernia. Vascular/Lymphatic: No significant vascular findings are present. No enlarged abdominal or pelvic lymph nodes. Reproductive: Revisualization of a LEFT adnexal cyst measuring 6.8 by 4.9 cm, previously 6.3 x 5.2 cm. Differences in measurement are likely  due to differences in orientation. Uterus is unremarkable. Other: No free air or free fluid. Small amount of fat stranding near the umbilicus likely due to recent cholecystectomy port. No focal drainable fluid collection. Musculoskeletal: No acute or significant osseous findings. IMPRESSION: 1. No CT etiology for acute abdominal pain identified. 2. Revisualization of a LEFT adnexal cyst measuring up to 6.8 cm. Recommend follow-up pelvic ultrasound in 6-12 months. If there is concern for acute ovarian torsion, recommend correlation with physical exam findings with pelvic ultrasound as deemed necessary. 3. Small amount of fat stranding near the umbilicus likely due to recent cholecystectomy port. No focal drainable fluid collection. Electronically Signed   By: Valentino Saxon M.D.   On: 11/25/2022 14:38    EKG: I independently viewed the EKG done and my findings are as followed: EKG was not done in the ED  Assessment/Plan Present on Admission:  Intractable abdominal pain  Principal Problem:   Intractable abdominal pain Active Problems:   Iron deficiency anemia   Adnexal cyst   GERD (gastroesophageal reflux disease)  Intractable abdominal pain possibly secondary to multifactorial Continue IV NS at 100 mLs/Hr Continue IV Dilaudid 0.5 mg q.3h p.r.n. for moderate to severe pain Continue Protonix Continue IV Zofran p.r.n. Continue clear liquid diet with plan to advance diet as tolerated Gastroenterologist was consulted to follow up with patient in the morning per ED physician  Transaminitis This is possibly secondary to recent ERCP procedure Continue to monitor liver enzymes  Hypoalbuminemia possibly secondary to mild protein calorie malnutrition Albumin 3.4, protein supplement to be provided  Iron deficiency anemia Continue ferrous sulfate next  Left adnexal cyst CT abdomen pelvis without contrast showed a revisualization of a LEFT adnexal cyst measuring up to 6.8 cm. Follow-up pelvic  ultrasound in 6-12 months. If there is concern for acute ovarian torsion, recommend correlation with physical exam findings with pelvic ultrasound as deemed necessary.  GERD Continue Protonix  DVT prophylaxis: Heparin subcu  Code Status: Full code  Family Communication: None at bedside  Consults: Gastroenterology by ED team   Severity of Illness: The appropriate patient status for this patient is INPATIENT. Inpatient status is judged to be reasonable and necessary in order to provide the required intensity of service to ensure the patient's safety. The patient's presenting symptoms, physical exam findings, and initial radiographic and laboratory data in the context of their chronic comorbidities is felt to place them at high risk for further clinical deterioration. Furthermore, it is not anticipated that the patient will be medically stable for discharge from the hospital within 2 midnights of admission.   * I certify that at the point of admission it is my clinical judgment that the patient will require inpatient hospital care spanning beyond 2 midnights from the point of admission due to high intensity of service, high risk for further deterioration and high frequency of surveillance required.*  Author: Bernadette Hoit, DO 11/26/2022 3:23 AM  For on call review [email or web address].

## 2022-11-25 NOTE — H&P (Incomplete)
History and Physical    Patient: Ashlee Robertson [id number]:[id number] DOB: [birth year] DOA: 11/25/2022 DOS: the patient was seen and examined on 11/25/2022 PCP: Daisy Floro, MD  Patient coming from: Home  Chief Complaint:  Chief Complaint  Patient presents with  . Abdominal Pain  . Flank Pain   HPI: Ashlee Robertson is a 51 y.o. female with medical history significant of postcholecystectomy about 1 month ago complicated by choledocholithiasis requiring ERCP on 2/22 who presents to the emergency department due to 1 day onset of left-sided abdominal pain.  ED Course:  In the emergency department, temperature was 97.5 F, respiratory rate 17/min, pulse 82 bpm, BP 118/52, O2 sat was 98%.  Workup in the ED showed normal CBC and BMP, albumin 3.4, AST 52, ALT 170, ALP 295, total bilirubin 1.4, lipase 35 Right upper quadrant abdominal ultrasound showed mildly enlarged and fatty liver.  Correlation with LFTs recommended to exclude steatohepatitis. CT abdomen and pelvis without contrast showed no CT etiology for acute abdominal pain identified Patient was treated with IV Dilaudid 1 mg x 3 and IV Zofran 4 mg x 3.  IV hydration was given. Gastroenterologist was consulted and will follow-up with patient on consult in the morning per ED physician.  Hospitalist was asked to admit patient for further evaluation and management.  Review of Systems: Review of systems as noted in the HPI. All other systems reviewed and are negative.   Past Medical History:  Diagnosis Date  . Varicose veins    Past Surgical History:  Procedure Laterality Date  . ABLATION     uterine  . CHOLECYSTECTOMY N/A 10/31/2022   Procedure: LAPAROSCOPIC CHOLECYSTECTOMY;  Surgeon: Axel Filler, MD;  Location: [facility] OR;  Service: General;  Laterality: N/A;  . CYSTOSCOPY    . ERCP N/A 11/22/2022   Procedure: ENDOSCOPIC RETROGRADE CHOLANGIOPANCREATOGRAPHY (ERCP);  Surgeon: Vida Rigger, MD;  Location: [facility] ENDOSCOPY;  Service:  Gastroenterology;  Laterality: N/A;  . laproscopy     ectopic preg  . REMOVAL OF STONES  11/22/2022   Procedure: REMOVAL OF STONES;  Surgeon: Vida Rigger, MD;  Location: [facility] ENDOSCOPY;  Service: Gastroenterology;;  . sacrospinous hysteropexy     anterior aproach  . SPHINCTEROTOMY  11/22/2022   Procedure: SPHINCTEROTOMY;  Surgeon: Vida Rigger, MD;  Location: [facility] ENDOSCOPY;  Service: Gastroenterology;;    Social History:  reports that she has never smoked. She has never used smokeless tobacco. She reports that she does not drink alcohol and does not use drugs.   No Known Allergies  History reviewed. No pertinent family history.  ***  Prior to Admission medications   Medication Sig Start Date End Date Taking? Authorizing Provider  esomeprazole (NEXIUM) 20 MG capsule Take 20 mg by mouth daily.   Yes [provider]  ibuprofen (ADVIL) 200 MG tablet Take 400 mg by mouth every 8 (eight) hours as needed (pain.).   Yes [provider]    Physical Exam: BP (!) 118/52 (BP Location: Right Arm)   Pulse 82   Temp (!) 97.5 F (36.4 C) (Oral)   Resp 17   Ht 5\' 5"  (1.651 m)   Wt 83.9 kg   SpO2 98%   BMI 30.79 kg/m   General: 51 y.o. year-old female well developed well nourished in no acute distress.  Alert and oriented x3. HEENT: NCAT, EOMI Neck: Supple, trachea medial Cardiovascular: Regular rate and rhythm with no rubs or gallops.  No thyromegaly or JVD noted.  No lower extremity edema. 2/4  pulses in all 4 extremities. Respiratory: Clear to auscultation with no wheezes or rales. Good inspiratory effort. Abdomen: Soft, tender to palpation of upper quadrants.  Positive left CVA tenderness  Muskuloskeletal: No cyanosis, clubbing or edema noted bilaterally Neuro: CN II-XII intact, strength 5/5 x 4, sensation, reflexes intact Skin: No ulcerative lesions noted or rashes Psychiatry: Judgement and insight appear normal. Mood is appropriate for condition and setting           Labs on Admission:  Basic Metabolic Panel: Recent Labs  Lab 11/25/22 1223  NA 138  K 3.7  CL 104  CO2 28  GLUCOSE 94  BUN 9  CREATININE 0.52  CALCIUM 8.9   Liver Function Tests: Recent Labs  Lab 11/25/22 1223  AST 52*  ALT 170*  ALKPHOS 295*  BILITOT 1.4*  PROT 6.8  ALBUMIN 3.4*   Recent Labs  Lab 11/25/22 1223  LIPASE 35   No results for input(s): "AMMONIA" in the last 168 hours. CBC: Recent Labs  Lab 11/25/22 1223  WBC 7.5  NEUTROABS 5.4  HGB 12.9  HCT 39.4  MCV 91.2  PLT 366   Cardiac Enzymes: No results for input(s): "CKTOTAL", "CKMB", "CKMBINDEX", "TROPONINI" in the last 168 hours.  BNP (last 3 results) No results for input(s): "BNP" in the last 8760 hours.  ProBNP (last 3 results) No results for input(s): "PROBNP" in the last 8760 hours.  CBG: No results for input(s): "GLUCAP" in the last 168 hours.  Radiological Exams on Admission: US Abdomen Limited RUQ (LIVER/GB)  Result Date: 11/25/2022 CLINICAL DATA:  Right upper quadrant abdominal pain. Cholecystectomy. EXAM: ULTRASOUND ABDOMEN LIMITED RIGHT UPPER QUADRANT COMPARISON:  None Available. FINDINGS: Gallbladder: Cholecystectomy. Common bile duct: Diameter: 2 mm Liver: There is diffuse increased liver echogenicity most commonly seen in the setting of fatty infiltration. Superimposed inflammation or fibrosis is not excluded. Clinical correlation is recommended. The liver is enlarged measuring 17 cm in length. Portal vein is patent on color Doppler imaging with normal direction of blood flow towards the liver. Other: None. IMPRESSION: 1. Mildly enlarged and fatty liver. Correlation with LFTs recommended to exclude steatohepatitis. 2. Cholecystectomy. Electronically Signed   By: Elgie Collard M.D.   On: 11/25/2022 21:25   CT Renal Stone Study  Result Date: 11/25/2022 CLINICAL DATA:  Abdominal/flank pain, stone suspected EXAM: CT ABDOMEN AND PELVIS WITHOUT CONTRAST TECHNIQUE: Multidetector CT  imaging of the abdomen and pelvis was performed following the standard protocol without IV contrast. RADIATION DOSE REDUCTION: This exam was performed according to the departmental dose-optimization program which includes automated exposure control, adjustment of the mA and/or kV according to patient size and/or use of iterative reconstruction technique. COMPARISON:  October 30, 2022 FINDINGS: Evaluation is limited by lack of IV contrast. Lower chest: No acute abnormality. Hepatobiliary: Status post interval cholecystectomy. Unremarkable noncontrast appearance of the liver. No definitive common bile duct dilation is noted but evaluation is limited by lack of IV contrast. Pancreas: No peripancreatic fat stranding. Spleen: Unremarkable. Adrenals/Urinary Tract: Adrenal glands are unremarkable. No hydronephrosis. No nephrolithiasis or obstructing nephrolithiasis. Bladder is unremarkable. Stomach/Bowel: No evidence of bowel obstruction. Appendix is normal. Small hiatal hernia. Vascular/Lymphatic: No significant vascular findings are present. No enlarged abdominal or pelvic lymph nodes. Reproductive: Revisualization of a LEFT adnexal cyst measuring 6.8 by 4.9 cm, previously 6.3 x 5.2 cm. Differences in measurement are likely due to differences in orientation. Uterus is unremarkable. Other: No free air or free fluid. Small amount of fat stranding near the umbilicus likely  due to recent cholecystectomy port. No focal drainable fluid collection. Musculoskeletal: No acute or significant osseous findings. IMPRESSION: 1. No CT etiology for acute abdominal pain identified. 2. Revisualization of a LEFT adnexal cyst measuring up to 6.8 cm. Recommend follow-up pelvic ultrasound in 6-12 months. If there is concern for acute ovarian torsion, recommend correlation with physical exam findings with pelvic ultrasound as deemed necessary. 3. Small amount of fat stranding near the umbilicus likely due to recent cholecystectomy port. No  focal drainable fluid collection. Electronically Signed   By: Meda Klinefelter M.D.   On: 11/25/2022 14:38    EKG: I independently viewed the EKG done and my findings are as followed: EKG was not done in the ED  Assessment/Plan Present on Admission: . Intractable abdominal pain  Principal Problem:   Intractable abdominal pain  Intractable abdominal pain possibly secondary to multifactorial Continue IV NS at 100 mLs/Hr Continue IV Dilaudid 0.5 mg q.3h p.r.n. for moderate to severe pain Continue IV Zofran p.r.n. Continue clear liquid diet with plan to advance diet as tolerated Gastroenterologist was consulted to follow up with patient in the morning per ED physician  Iron deficiency anemia Continue ferrous sulfate next  Left adnexal cyst CT abdomen pelvis without contrast showed a revisualization of a LEFT adnexal cyst measuring up to 6.8 cm. Follow-up pelvic ultrasound in 6-12 months. If there is concern for acute ovarian torsion, recommend correlation with physical exam findings with pelvic ultrasound as deemed necessary.  GERD Continue Protonix  DVT prophylaxis: Heparin subcu  Code Status: Full code  Family Communication: None at bedside  Consults: Gastroenterology by ED team   Severity of Illness: The appropriate patient status for this patient is INPATIENT. Inpatient status is judged to be reasonable and necessary in order to provide the required intensity of service to ensure the patient's safety. The patient's presenting symptoms, physical exam findings, and initial radiographic and laboratory data in the context of their chronic comorbidities is felt to place them at high risk for further clinical deterioration. Furthermore, it is not anticipated that the patient will be medically stable for discharge from the hospital within 2 midnights of admission.   * I certify that at the point of admission it is my clinical judgment that the patient will require inpatient  hospital care spanning beyond 2 midnights from the point of admission due to high intensity of service, high risk for further deterioration and high frequency of surveillance required.*  Author: Frankey Shown, DO 11/25/2022 10:05 PM  For on call review [email or web address].

## 2022-11-25 NOTE — ED Provider Notes (Signed)
[facility] EMERGENCY DEPARTMENT Provider Note   CSN: [id number] Arrival date & time: 11/25/22  1139     History  Chief Complaint  Patient presents with   Abdominal Pain   Flank Pain    Ashlee Robertson is a 51 y.o. female.  51 year old female who is recently status postcholecystectomy about 1 month ago complicated by choledocholithiasis requiring ERCP on 12/22 presents today for evaluation of left-sided abdominal pain that started yesterday.  Has been persistent since.  States that any amount of movement makes the pain worse.  If she stays still she states there is no pain.  She denies vomiting, dysuria, vaginal discharge, melanotic stools, hematochezia.  Prior to arrival she has taken Tylenol without significant improvement.  She received a dose of Zofran when she arrived to the emergency department about 4-1/2 hours ago with improvement in nausea.  The history is provided by the patient. No language interpreter was used.       Home Medications Prior to Admission medications   Medication Sig Start Date End Date Taking? Authorizing Provider  esomeprazole (NEXIUM) 20 MG capsule Take 20 mg by mouth daily.    [provider]  ibuprofen (ADVIL) 200 MG tablet Take 400 mg by mouth every 8 (eight) hours as needed (pain.).    [provider]  polyethylene glycol (MIRALAX) 17 g packet Take 17 g by mouth daily as needed for mild constipation. Patient not taking: Reported on 11/21/2022 10/31/22   Margie Billet A, PA-C  traMADol (ULTRAM) 50 MG tablet Take 1 tablet (50 mg total) by mouth every 6 (six) hours as needed for severe pain. Patient not taking: Reported on 11/21/2022 10/31/22   Wellington Hampshire, PA-C      Allergies    Patient has no known allergies.    Review of Systems   Review of Systems  Constitutional:  Negative for activity change and chills.  Respiratory:  Negative for shortness of breath.   All other systems reviewed and are  negative.   Physical Exam Updated Vital Signs BP (!) 125/53 (BP Location: Right Arm)   Pulse 77   Temp 98.1 F (36.7 C) (Oral)   Resp 18   Ht _0  (1.651 m)   Wt 83.9 kg   SpO2 100%   BMI 30.79 kg/m  Physical Exam Vitals and nursing note reviewed.  Constitutional:      General: She is not in acute distress.    Appearance: Normal appearance. She is not ill-appearing.  HENT:     Head: Normocephalic and atraumatic.     Nose: Nose normal.  Eyes:     General: No scleral icterus.    Extraocular Movements: Extraocular movements intact.     Conjunctiva/sclera: Conjunctivae normal.  Cardiovascular:     Rate and Rhythm: Normal rate and regular rhythm.     Pulses: Normal pulses.  Pulmonary:     Effort: Pulmonary effort is normal. No respiratory distress.     Breath sounds: Normal breath sounds. No wheezing or rales.  Abdominal:     General: There is no distension.     Palpations: Abdomen is soft.     Tenderness: There is abdominal tenderness (Right upper quadrant, epigastric region, left upper quadrant). There is left CVA tenderness. There is no right CVA tenderness, guarding or rebound.  Musculoskeletal:        General: Normal range of motion.     Cervical back: Normal range of motion.  Skin:    General:  Skin is warm and dry.  Neurological:     General: No focal deficit present.     Mental Status: She is alert. Mental status is at baseline.     ED Results / Procedures / Treatments   Labs (all labs ordered are listed, but only abnormal results are displayed) Labs Reviewed  COMPREHENSIVE METABOLIC PANEL - Abnormal; Notable for the following components:      Result Value   Albumin 3.4 (*)    AST 52 (*)    ALT 170 (*)    Alkaline Phosphatase 295 (*)    Total Bilirubin 1.4 (*)    All other components within normal limits  URINALYSIS, ROUTINE W REFLEX MICROSCOPIC - Abnormal; Notable for the following components:   Color, Urine STRAW (*)    All other components within  normal limits  CBC WITH DIFFERENTIAL/PLATELET - Abnormal; Notable for the following components:   RDW 15.6 (*)    All other components within normal limits  LIPASE, BLOOD  I-STAT BETA HCG BLOOD, ED ([facility], [facility], [facility] ONLY)    EKG None  Radiology CT Renal Stone Study  Result Date: 11/25/2022 CLINICAL DATA:  Abdominal/flank pain, stone suspected EXAM: CT ABDOMEN AND PELVIS WITHOUT CONTRAST TECHNIQUE: Multidetector CT imaging of the abdomen and pelvis was performed following the standard protocol without IV contrast. RADIATION DOSE REDUCTION: This exam was performed according to the departmental dose-optimization program which includes automated exposure control, adjustment of the mA and/or kV according to patient size and/or use of iterative reconstruction technique. COMPARISON:  October 30, 2022 FINDINGS: Evaluation is limited by lack of IV contrast. Lower chest: No acute abnormality. Hepatobiliary: Status post interval cholecystectomy. Unremarkable noncontrast appearance of the liver. No definitive common bile duct dilation is noted but evaluation is limited by lack of IV contrast. Pancreas: No peripancreatic fat stranding. Spleen: Unremarkable. Adrenals/Urinary Tract: Adrenal glands are unremarkable. No hydronephrosis. No nephrolithiasis or obstructing nephrolithiasis. Bladder is unremarkable. Stomach/Bowel: No evidence of bowel obstruction. Appendix is normal. Small hiatal hernia. Vascular/Lymphatic: No significant vascular findings are present. No enlarged abdominal or pelvic lymph nodes. Reproductive: Revisualization of a LEFT adnexal cyst measuring 6.8 by 4.9 cm, previously 6.3 x 5.2 cm. Differences in measurement are likely due to differences in orientation. Uterus is unremarkable. Other: No free air or free fluid. Small amount of fat stranding near the umbilicus likely due to recent cholecystectomy port. No focal drainable fluid collection. Musculoskeletal: No acute or significant osseous findings.  IMPRESSION: 1. No CT etiology for acute abdominal pain identified. 2. Revisualization of a LEFT adnexal cyst measuring up to 6.8 cm. Recommend follow-up pelvic ultrasound in 6-12 months. If there is concern for acute ovarian torsion, recommend correlation with physical exam findings with pelvic ultrasound as deemed necessary. 3. Small amount of fat stranding near the umbilicus likely due to recent cholecystectomy port. No focal drainable fluid collection. Electronically Signed   By: Valentino Saxon M.D.   On: 11/25/2022 14:38    Procedures Procedures    Medications Ordered in ED Medications  ondansetron (ZOFRAN-ODT) disintegrating tablet 4 mg (4 mg Oral Given 11/25/22 1233)  lactated ringers bolus 1,000 mL (1,000 mLs Intravenous New Bag/Given 11/25/22 1700)  ondansetron (ZOFRAN) injection 4 mg (4 mg Intravenous Given 11/25/22 1657)  HYDROmorphone (DILAUDID) injection 1 mg (1 mg Intravenous Given 11/25/22 1657)    ED Course/ Medical Decision Making/ A&P  Medical Decision Making Amount and/or Complexity of Data Reviewed Radiology: ordered.  Risk Prescription drug management. Decision regarding hospitalization.   Medical Decision Making / ED Course   This patient presents to the ED for concern of abdominal pain, this involves an extensive number of treatment options, and is a complaint that carries with it a high risk of complications and morbidity.  The differential diagnosis includes complication from ERCP, pancreatitis, gastritis, gastroenteritis, recurrent stone  MDM: 51 year old female presents with above-mentioned complaints.  Recently underwent ERCP on 12/22.  Currently she is afebrile.  Workup that was initiated through [facility] is overall reassuring.  CBC is without leukocytosis.  Hemoglobin is normal.  CMP shows alk phos of 295, T. bili of 1.4, AST of 52, ALT of 170.  Patient has recent labs that were done by gastroenterology office within the past week.   Alk phos, LFTs, and T. bili are all downtrending in comparison with these labs.  Alk phos was in the 500 range, ALT was in the 200 range, T. bili was mid 2 at the time.  UA without evidence of UTI.  Lipase within normal limits.  Low suspicion for pancreatitis, or recurrent obstruction given that the LFTs, alk phos are downtrending.  She is without jaundice.  CT renal stone study shows no evidence of kidney stone.  Also shows no other acute intra-abdominal process.  Case discussed with gastroenterologist who after reviewing the labs feels that there is low risk for recurrent stone given that the LFTs are downtrending, all of the stones were removed during recent ERCP.  She does recommend we obtain a right upper quadrant ultrasound to ensure for there is no acute concerns from that standpoint.  Also there is no evidence of gastritis on recent ERCP.  Gastroenterologist recommends if right upper quadrant ultrasound is normal and patient's symptoms or improved following symptomatic management patient is appropriate for discharge for outpatient follow-up.  If patient's symptoms are uncontrolled and patient is admitted they will follow patient in consult.  Right upper quadrant ultrasound without acute findings.  Patient after multiple rounds of pain medication reports no significant improvement in pain control.  Discussed with hospitalist who will evaluate patient for admission.  Secure chat message sent to gastroenterologist so they can follow in consult tomorrow.   Additional history obtained: -Additional history obtained from recent admission where patient underwent cholecystectomy.  Recent procedure with Dr. Watt Climes for ERCP. -External records from outside source obtained and reviewed including: Chart review including previous notes, labs, imaging, consultation notes   Lab Tests: -I ordered, reviewed, and interpreted labs.   The pertinent results include:   Labs Reviewed  COMPREHENSIVE METABOLIC PANEL -  Abnormal; Notable for the following components:      Result Value   Albumin 3.4 (*)    AST 52 (*)    ALT 170 (*)    Alkaline Phosphatase 295 (*)    Total Bilirubin 1.4 (*)    All other components within normal limits  URINALYSIS, ROUTINE W REFLEX MICROSCOPIC - Abnormal; Notable for the following components:   Color, Urine STRAW (*)    All other components within normal limits  CBC WITH DIFFERENTIAL/PLATELET - Abnormal; Notable for the following components:   RDW 15.6 (*)    All other components within normal limits  LIPASE, BLOOD  I-STAT BETA HCG BLOOD, ED ([facility], [facility], [facility] ONLY)      EKG  EKG Interpretation  Date/Time:    Ventricular Rate:    PR Interval:  QRS Duration:   QT Interval:    QTC Calculation:   R Axis:     Text Interpretation:           Imaging Studies ordered: I ordered imaging studies including ct renal stone study. RUQ [place] I independently visualized and interpreted imaging. I agree with the radiologist interpretation   Medicines ordered and prescription drug management: Meds ordered this encounter  Medications   ondansetron (ZOFRAN-ODT) disintegrating tablet 4 mg   lactated ringers bolus 1,000 mL   ondansetron (ZOFRAN) injection 4 mg   HYDROmorphone (DILAUDID) injection 1 mg    -I have reviewed the patients home medicines and have made adjustments as needed  Critical interventions Pain control, IV fluids  Consultations Obtained: I requested consultation with the gastroenterologist,  and discussed lab and imaging findings as well as pertinent plan - they recommend: as above    Reevaluation: After the interventions noted above, I reevaluated the patient and found that they have :stayed the same  Co morbidities that complicate the patient evaluation  Past Medical History:  Diagnosis Date   Varicose veins       Dispostion: Patient discussed with hospitalist will evaluate patient for admission.   Final Clinical Impression(s) / ED  Diagnoses Final diagnoses:  Abdominal pain, unspecified abdominal location    Rx / DC Orders ED Discharge Orders     None         Evlyn Courier, PA-C 11/25/22 2204    Isla Pence, MD 11/26/22 2349

## 2022-11-25 NOTE — ED Notes (Signed)
i

## 2022-11-25 NOTE — ED Provider Triage Note (Signed)
Emergency Medicine Provider Triage Evaluation Note  Ashlee Robertson , a 51 y.o. female  was evaluated in triage.  Pt complains of left side abdominal pain and flank pain.  Patient states the pain started suddenly yesterday and was very severe.  She has also had associated nausea and diarrhea.  Pt recently had gallbladder removed and ERCP to remove stone in a duct.  Denies right side abdominal pain, fever, vomiting, urinary changes, melena, hematochezia.    Review of Systems  Positive: As above Negative: As above  Physical Exam  BP (!) 150/76   Pulse 93   Temp 98.5 F (36.9 C)   Resp 17   Ht 5\' 5"  (1.651 m)   Wt 83.9 kg   SpO2 100%   BMI 30.79 kg/m  Gen:   Awake, no distress   Resp:  Normal effort  MSK:   Moves extremities without difficulty  Other:  Left CVA tenderness, left side flank and abdominal pain with palpation, abdomen is soft, non distended  Medical Decision Making  Medically screening exam initiated at 12:16 PM.  Appropriate orders placed.  was informed that the remainder of the evaluation will be completed by another provider, this initial triage assessment does not replace that evaluation, and the importance of remaining in the ED until their evaluation is complete.     [name] R, Melton Alar 11/25/22 1218

## 2022-11-25 NOTE — ED Triage Notes (Signed)
Pt reports she had her gallbladder removed about 3 weeks ago, she had to have a gall stone removed on Friday. Pt reports since yesterday, she has been having left flank pain and left upper abdominal pain. Reports nausea and diarrhea since this morning.

## 2022-11-26 ENCOUNTER — Other Ambulatory Visit: Payer: Self-pay

## 2022-11-26 DIAGNOSIS — R109 Unspecified abdominal pain: Secondary | ICD-10-CM | POA: Diagnosis not present

## 2022-11-26 DIAGNOSIS — E8809 Other disorders of plasma-protein metabolism, not elsewhere classified: Secondary | ICD-10-CM | POA: Insufficient documentation

## 2022-11-26 DIAGNOSIS — R7401 Elevation of levels of liver transaminase levels: Secondary | ICD-10-CM | POA: Insufficient documentation

## 2022-11-26 DIAGNOSIS — N949 Unspecified condition associated with female genital organs and menstrual cycle: Secondary | ICD-10-CM | POA: Insufficient documentation

## 2022-11-26 DIAGNOSIS — D509 Iron deficiency anemia, unspecified: Secondary | ICD-10-CM | POA: Insufficient documentation

## 2022-11-26 DIAGNOSIS — K219 Gastro-esophageal reflux disease without esophagitis: Secondary | ICD-10-CM | POA: Insufficient documentation

## 2022-11-26 LAB — COMPREHENSIVE METABOLIC PANEL
ALT: 134 U/L — ABNORMAL HIGH (ref 0–44)
AST: 55 U/L — ABNORMAL HIGH (ref 15–41)
Albumin: 3 g/dL — ABNORMAL LOW (ref 3.5–5.0)
Alkaline Phosphatase: 239 U/L — ABNORMAL HIGH (ref 38–126)
Anion gap: 8 (ref 5–15)
BUN: 8 mg/dL (ref 6–20)
CO2: 23 mmol/L (ref 22–32)
Calcium: 8.5 mg/dL — ABNORMAL LOW (ref 8.9–10.3)
Chloride: 104 mmol/L (ref 98–111)
Creatinine, Ser: 0.45 mg/dL (ref 0.44–1.00)
GFR, Estimated: >60 mL/min (ref >60)
Glucose, Bld: 108 mg/dL — ABNORMAL HIGH (ref 70–99)
Potassium: 3.5 mmol/L (ref 3.5–5.1)
Sodium: 135 mmol/L (ref 135–145)
Total Bilirubin: 0.8 mg/dL (ref 0.3–1.2)
Total Protein: 5.9 g/dL — ABNORMAL LOW (ref 6.5–8.1)

## 2022-11-26 LAB — CBC WITH DIFFERENTIAL/PLATELET
Abs Immature Granulocytes: 0.02 10*3/uL (ref 0.00–0.07)
Basophils Absolute: 0 10*3/uL (ref 0.0–0.1)
Basophils Relative: 0 %
Eosinophils Absolute: 0.1 10*3/uL (ref 0.0–0.5)
Eosinophils Relative: 3 %
HCT: 36.7 % (ref 36.0–46.0)
Hemoglobin: 11.7 g/dL — ABNORMAL LOW (ref 12.0–15.0)
Immature Granulocytes: 0 %
Lymphocytes Relative: 24 %
Lymphs Abs: 1.1 10*3/uL (ref 0.7–4.0)
MCH: 29 pg (ref 26.0–34.0)
MCHC: 31.9 g/dL (ref 30.0–36.0)
MCV: 91.1 fL (ref 80.0–100.0)
Monocytes Absolute: 0.5 10*3/uL (ref 0.1–1.0)
Monocytes Relative: 12 %
Neutro Abs: 2.8 10*3/uL (ref 1.7–7.7)
Neutrophils Relative %: 61 %
Platelets: 317 10*3/uL (ref 150–400)
RBC: 4.03 MIL/uL (ref 3.87–5.11)
RDW: 15.5 % (ref 11.5–15.5)
WBC: 4.5 10*3/uL (ref 4.0–10.5)
nRBC: 0 % (ref 0.0–0.2)

## 2022-11-26 MED ORDER — PANTOPRAZOLE SODIUM 40 MG IV SOLR: 40.0000 mg | INTRAVENOUS | Status: DC

## 2022-11-26 MED ORDER — ACETAMINOPHEN 325 MG PO TABS: 650.0000 mg | ORAL_TABLET | Freq: Four times a day (QID) | ORAL | Status: DC | PRN

## 2022-11-26 MED ORDER — ENSURE ENLIVE PO LIQD: 237.0000 mL | Freq: Two times a day (BID) | ORAL | Status: DC

## 2022-11-26 MED ORDER — ORAL CARE MOUTH RINSE: 15.0000 mL | OROMUCOSAL | Status: DC | PRN

## 2022-11-26 MED ORDER — ONDANSETRON HCL 4 MG/2ML IJ SOLN
4.0000 mg | Freq: Four times a day (QID) | INTRAMUSCULAR | Status: DC | PRN
  Administered 2022-11-26 (×2): 4 mg via INTRAVENOUS
  Filled 2022-11-26 (×2): qty 2

## 2022-11-26 MED ORDER — TRAMADOL HCL 50 MG PO TABS
50.0000 mg | ORAL_TABLET | Freq: Four times a day (QID) | ORAL | Status: DC | PRN
  Administered 2022-11-26 – 2022-11-27 (×3): 50 mg via ORAL
  Filled 2022-11-26 (×3): qty 1

## 2022-11-26 NOTE — Progress Notes (Signed)
[name] 2:05 PM  Subjective: Patient known to me from recent ERCP she is actually doing better today and wants to try to eat and request something milder for pain she does not think she needs to have your medicine and she was fine after ERCP for few days with less itching and yellow jaundice and her pain came on with movement on her left side fairly lateral and she did have a little diarrhea which we discussed but no other new complaints reassurance was given about CT labs etc. case discussed with her husband as well  Objective: Signs stable afebrile no acute distress abdomen is soft nontender no guarding or rebound liver test slow decrease white count okay CT okay except for ovarian cyst  Assessment: Probably either musculoskeletal pain or adhesional pain  Plan: Advance diet and okay for Tylenol or Ultram if she does well hopefully can go home soon and if any concerns about a residual CBD stone would need a repeat MRCP holding off on that for now and will check on tomorrow during rounds if she is still here please call sooner if any question or problem  [facility]  office [phone] After 5PM or if no answer call [phone]

## 2022-11-26 NOTE — Progress Notes (Signed)
PROGRESS NOTE    Ashlee Robertson  EXH:[id number] DOB: 08-08-1971 DOA: 11/25/2022 PCP: Daisy Floro, MD   Brief Narrative:  HPI: Ashlee Robertson is a 51 y.o. female with medical history significant of postcholecystectomy about 1 month ago complicated by choledocholithiasis requiring ERCP on 12/22 who presents to the emergency department due to 1 day onset of left-sided abdominal pain.  Patient states that she was doing well after the ERCP, but on Sunday (12/24), while sitting in the front passenger seat in a car with husband, she turned slightly towards her husband to show him something on the phone and she felt a sharp pain in the left upper quadrant which wraps towards the middle of her back, pain was sharp, persistent and was rated as 8-9/10 on pain scale.  Pain worsens with movement, pain is alleviated with rest.  Tylenol taken at home did not alleviate the pain, so she decided to go to the ED for further evaluation and management.  She denies fever, chills, chest pain, shortness of breath, headache.  Patient denies history of alcohol use, tobacco use or any other recreational drug use.   ED Course:  In the emergency department, temperature was 97.5 F, respiratory rate 17/min, pulse 82 bpm, BP 118/52, O2 sat was 98%.  Workup in the ED showed normal CBC and BMP, albumin 3.4, AST 52, ALT 170, ALP 295, total bilirubin 1.4, lipase 35 Right upper quadrant abdominal ultrasound showed mildly enlarged and fatty liver.  Correlation with LFTs recommended to exclude steatohepatitis. CT abdomen and pelvis without contrast showed no CT etiology for acute abdominal pain identified Patient was treated with IV Dilaudid 1 mg x 3 and IV Zofran 4 mg x 3.  IV hydration was given. Gastroenterologist was consulted and will follow-up with patient on consult in the morning per ED physician.  Hospitalist was asked to admit patient for further evaluation and management.  Assessment & Plan:   Principal Problem:    Intractable abdominal pain Active Problems:   Iron deficiency anemia   Adnexal cyst   GERD (gastroesophageal reflux disease)   Transaminitis   Hypoalbuminemia due to protein-calorie malnutrition (HCC)  Intractable abdominal pain possibly of unknown etiology, elevated LFTs: Patient is status post ERCP, Patient states that she was doing well after the ERCP, but on Sunday (12/24).  Now with elevated LFTs.  Pain slightly better than yesterday but she is still nauseous.  CT abdomen this time with no acute pathology.  Will resume as needed Zofran, transition to IV Protonix.  Awaiting GI consultation.  Repeating CMP.   Iron deficiency anemia Continue ferrous sulfate next   Left adnexal cyst CT abdomen pelvis without contrast showed a revisualization of a LEFT adnexal cyst measuring up to 6.8 cm. Follow-up pelvic ultrasound in 6-12 months. If there is concern for acute ovarian torsion, recommend correlation with physical exam findings with pelvic ultrasound as deemed necessary.  Patient's abdominal pain and tenderness is mostly in the upper abdomen, clinically I doubt ovarian torsion.   GERD Continue Protonix  DVT prophylaxis: heparin injection 5,000 Units Start: 11/26/22 0045 SCDs Start: 11/25/22 2348   Code Status: Full Code  Family Communication:  None present at bedside.  Plan of care discussed with patient in length and he/she verbalized understanding and agreed with it.  Status is: Inpatient Remains inpatient appropriate because: Still with pain and elevated LFTs.   Estimated body mass index is 30.79 kg/m as calculated from the following:   Height as of this encounter:  5\' 5"  (1.651 m).   Weight as of this encounter: 83.9 kg.    Nutritional Assessment: Body mass index is 30.79 kg/m. Seen by dietician.  I agree with the assessment and plan as outlined below: Nutrition Status:        . Skin Assessment: I have examined the patient's skin and I agree with the wound assessment  as performed by the wound care RN as outlined below:    Consultants:  GI  Procedures:  None  Antimicrobials:  Anti-infectives (From admission, onward)    None         Subjective: Patient seen and examined.  She still complains of pain.  She tells me it aggravates with movement.  Without movement, it is 3 out of 10, with movement it jumps to 7 out of 10 and it was 10 out of 10 yesterday.  She still has nausea.  Pain is mostly located at the left upper quadrant.  Objective: Vitals:   11/25/22 1827 11/25/22 2312 11/25/22 2348 11/26/22 0419  BP: (!) 118/52 (!) 129/53  101/68  Pulse: 82 86  80  Resp: 17 16  16   Temp: (!) 97.5 F (36.4 C) 98.4 F (36.9 C)  97.8 F (36.6 C)  TempSrc: Oral Oral  Oral  SpO2: 98% 96% 99% 98%  Weight:      Height:        Intake/Output Summary (Last 24 hours) at 11/26/2022 0924 Last data filed at 11/26/2022 0900 Gross per 24 hour  Intake 602.06 ml  Output 100 ml  Net 502.06 ml   Filed Weights   11/25/22 1206  Weight: 83.9 kg    Examination:  General exam: Appears calm and comfortable, obese Respiratory system: Clear to auscultation. Respiratory effort normal. Cardiovascular system: S1 & S2 heard, RRR. No JVD, murmurs, rubs, gallops or clicks. No pedal edema. Gastrointestinal system: Abdomen is nondistended, soft and tender at right upper quadrant, epigastrium and left upper quadrant.. No organomegaly or masses felt. Normal bowel sounds heard. Central nervous system: Alert and oriented. No focal neurological deficits. Extremities: Symmetric 5 x 5 power. Skin: No rashes, lesions or ulcers Psychiatry: Judgement and insight appear normal. Mood & affect appropriate.    Data Reviewed: I have personally reviewed following labs and imaging studies  CBC: Recent Labs  Lab 11/25/22 1223  WBC 7.5  NEUTROABS 5.4  HGB 12.9  HCT 39.4  MCV 91.2  PLT 366   Basic Metabolic Panel: Recent Labs  Lab 11/25/22 1223  NA 138  K 3.7  CL 104   CO2 28  GLUCOSE 94  BUN 9  CREATININE 0.52  CALCIUM 8.9   GFR: Estimated Creatinine Clearance: 87 mL/min (by C-G formula based on SCr of 0.52 mg/dL). Liver Function Tests: Recent Labs  Lab 11/25/22 1223  AST 52*  ALT 170*  ALKPHOS 295*  BILITOT 1.4*  PROT 6.8  ALBUMIN 3.4*   Recent Labs  Lab 11/25/22 1223  LIPASE 35   No results for input(s): "AMMONIA" in the last 168 hours. Coagulation Profile: No results for input(s): "INR", "PROTIME" in the last 168 hours. Cardiac Enzymes: No results for input(s): "CKTOTAL", "CKMB", "CKMBINDEX", "TROPONINI" in the last 168 hours. BNP (last 3 results) No results for input(s): "PROBNP" in the last 8760 hours. HbA1C: No results for input(s): "HGBA1C" in the last 72 hours. CBG: No results for input(s): "GLUCAP" in the last 168 hours. Lipid Profile: No results for input(s): "CHOL", "HDL", "LDLCALC", "TRIG", "CHOLHDL", "LDLDIRECT" in the last 72 hours.  Thyroid Function Tests: No results for input(s): "TSH", "T4TOTAL", "FREET4", "T3FREE", "THYROIDAB" in the last 72 hours. Anemia Panel: No results for input(s): "VITAMINB12", "FOLATE", "FERRITIN", "TIBC", "IRON", "RETICCTPCT" in the last 72 hours. Sepsis Labs: No results for input(s): "PROCALCITON", "LATICACIDVEN" in the last 168 hours.  No results found for this or any previous visit (from the past 240 hour(s)).   Radiology Studies: US Abdomen Limited RUQ (LIVER/GB)  Result Date: 11/25/2022 CLINICAL DATA:  Right upper quadrant abdominal pain. Cholecystectomy. EXAM: ULTRASOUND ABDOMEN LIMITED RIGHT UPPER QUADRANT COMPARISON:  None Available. FINDINGS: Gallbladder: Cholecystectomy. Common bile duct: Diameter: 2 mm Liver: There is diffuse increased liver echogenicity most commonly seen in the setting of fatty infiltration. Superimposed inflammation or fibrosis is not excluded. Clinical correlation is recommended. The liver is enlarged measuring 17 cm in length. Portal vein is patent on  color Doppler imaging with normal direction of blood flow towards the liver. Other: None. IMPRESSION: 1. Mildly enlarged and fatty liver. Correlation with LFTs recommended to exclude steatohepatitis. 2. Cholecystectomy. Electronically Signed   By: Elgie Collard M.D.   On: 11/25/2022 21:25   CT Renal Stone Study  Result Date: 11/25/2022 CLINICAL DATA:  Abdominal/flank pain, stone suspected EXAM: CT ABDOMEN AND PELVIS WITHOUT CONTRAST TECHNIQUE: Multidetector CT imaging of the abdomen and pelvis was performed following the standard protocol without IV contrast. RADIATION DOSE REDUCTION: This exam was performed according to the departmental dose-optimization program which includes automated exposure control, adjustment of the mA and/or kV according to patient size and/or use of iterative reconstruction technique. COMPARISON:  October 30, 2022 FINDINGS: Evaluation is limited by lack of IV contrast. Lower chest: No acute abnormality. Hepatobiliary: Status post interval cholecystectomy. Unremarkable noncontrast appearance of the liver. No definitive common bile duct dilation is noted but evaluation is limited by lack of IV contrast. Pancreas: No peripancreatic fat stranding. Spleen: Unremarkable. Adrenals/Urinary Tract: Adrenal glands are unremarkable. No hydronephrosis. No nephrolithiasis or obstructing nephrolithiasis. Bladder is unremarkable. Stomach/Bowel: No evidence of bowel obstruction. Appendix is normal. Small hiatal hernia. Vascular/Lymphatic: No significant vascular findings are present. No enlarged abdominal or pelvic lymph nodes. Reproductive: Revisualization of a LEFT adnexal cyst measuring 6.8 by 4.9 cm, previously 6.3 x 5.2 cm. Differences in measurement are likely due to differences in orientation. Uterus is unremarkable. Other: No free air or free fluid. Small amount of fat stranding near the umbilicus likely due to recent cholecystectomy port. No focal drainable fluid collection.  Musculoskeletal: No acute or significant osseous findings. IMPRESSION: 1. No CT etiology for acute abdominal pain identified. 2. Revisualization of a LEFT adnexal cyst measuring up to 6.8 cm. Recommend follow-up pelvic ultrasound in 6-12 months. If there is concern for acute ovarian torsion, recommend correlation with physical exam findings with pelvic ultrasound as deemed necessary. 3. Small amount of fat stranding near the umbilicus likely due to recent cholecystectomy port. No focal drainable fluid collection. Electronically Signed   By: Meda Klinefelter M.D.   On: 11/25/2022 14:38    Scheduled Meds:  feeding supplement  237 mL Oral BID BM   ferrous sulfate  325 mg Oral QODAY   heparin  5,000 Units Subcutaneous Q8H   pantoprazole (PROTONIX) IV  40 mg Intravenous Q24H   Continuous Infusions:  sodium chloride 100 mL/hr at 11/26/22 0600     LOS: 1 day   Hughie Closs, MD [facility]  11/26/2022, 9:24 AM   *Please note that this is a verbal dictation therefore any spelling or grammatical errors are due to the "  [employer]" system interpretation.  Please page via [place] and do not message via secure chat for urgent patient care matters. Secure chat can be used for non urgent patient care matters.  How to contact the [facility] Attending or Consulting provider [place] or covering provider during after hours [place], for this patient?  Check the care team in [facility] and look for a) attending/consulting [facility] provider listed and b) the [facility] team listed. Page or secure chat [phone]. Log into [email or web address] and use [place]'s universal password to access. If you do not have the password, please contact the hospital operator. Locate the [facility] provider you are looking for under [facility] and page to a number that you can be directly reached. If you still have difficulty reaching the provider, please page the [facility] (Director on Call) for the Hospitalists listed on amion for assistance.

## 2022-11-27 DIAGNOSIS — R109 Unspecified abdominal pain: Secondary | ICD-10-CM | POA: Diagnosis not present

## 2022-11-27 LAB — COMPREHENSIVE METABOLIC PANEL
ALT: 110 U/L — ABNORMAL HIGH (ref 0–44)
AST: 47 U/L — ABNORMAL HIGH (ref 15–41)
Albumin: 2.7 g/dL — ABNORMAL LOW (ref 3.5–5.0)
Alkaline Phosphatase: 203 U/L — ABNORMAL HIGH (ref 38–126)
Anion gap: 8 (ref 5–15)
BUN: <5 mg/dL — ABNORMAL LOW (ref 6–20)
CO2: 27 mmol/L (ref 22–32)
Calcium: 8.6 mg/dL — ABNORMAL LOW (ref 8.9–10.3)
Chloride: 103 mmol/L (ref 98–111)
Creatinine, Ser: 0.56 mg/dL (ref 0.44–1.00)
GFR, Estimated: >60 mL/min (ref >60)
Glucose, Bld: 104 mg/dL — ABNORMAL HIGH (ref 70–99)
Potassium: 3.3 mmol/L — ABNORMAL LOW (ref 3.5–5.1)
Sodium: 138 mmol/L (ref 135–145)
Total Bilirubin: 0.9 mg/dL (ref 0.3–1.2)
Total Protein: 5.4 g/dL — ABNORMAL LOW (ref 6.5–8.1)

## 2022-11-27 NOTE — Plan of Care (Signed)

## 2022-11-27 NOTE — Progress Notes (Signed)
Ashlee Robertson 9:36 AM  Subjective: Patient seen and examined case discussed with her husband that she is doing fine and eating fine and her pain is better and has no new complaints and no more diarrhea  Objective: Signs stable afebrile no acute distress abdomen is soft nontender liver test slight decrease  Assessment: Improved probable muscular skeletal pain  Plan: She will call me as needed any GI question or problem otherwise we will repeat liver test next week in our office based on her convenience which we discussed and follow-up with me as needed or in 3 months to set up probable screening colonoscopy  [facility]  office [phone] After 5PM or if no answer call [phone]

## 2022-11-27 NOTE — Progress Notes (Signed)
Ashlee Robertson to be D/C'd  per MD order.  Discussed with the patient and all questions fully answered.  VSS, Skin clean, dry and intact without evidence of skin break down, no evidence of skin tears noted.  IV catheter discontinued intact. Site without signs and symptoms of complications. Dressing and pressure applied.  An After Visit Summary was printed and given to the patient. Patient received prescription.  D/c education completed with patient/family including follow up instructions, medication list, d/c activities limitations if indicated, with other d/c instructions as indicated by MD - patient able to verbalize understanding, all questions fully answered.   Patient instructed to return to ED, call 911, or call MD for any changes in condition.   Patient to be escorted via WC, and D/C home via private auto.

## 2022-11-27 NOTE — Care Management (Signed)
  Transition of Care [facility]) Screening Note   Patient Details  Name: Ashlee Robertson Date of Birth: August 24, 1971   Transition of Care [facility]) CM/SW Contact:    Lawerance Sabal, RN Phone Number: 11/27/2022, 8:42 AM    Transition of Care Department [facility]) has reviewed patient and no TOC needs have been identified at this time. We will continue to monitor patient advancement through interdisciplinary progression rounds. If new patient transition needs arise, please place a TOC consult.

## 2022-11-27 NOTE — Discharge Summary (Signed)
Physician Discharge Summary  Ashlee Robertson [id number] DOB: 08-28-1971 DOA: 11/25/2022  PCP: Lawerance Cruel, MD  Admit date: 11/25/2022 Discharge date: 11/27/2022 30 Day Unplanned Readmission Risk Score    Flowsheet Row ED to Hosp-Admission (Current) from 11/25/2022 in [place]  30 Day Unplanned Readmission Risk Score (%) 8.88 Filed at 11/27/2022 0801       This score is the patient's risk of an unplanned readmission within 30 days of being discharged (0 -100%). The score is based on dignosis, age, lab data, medications, orders, and past utilization.   Low:  0-14.9   Medium: 15-21.9   High: 22-29.9   Extreme: 30 and above          Admitted From: Home Disposition: Home  Recommendations for Outpatient Follow-up:  Follow up with PCP in 1-2 weeks Please obtain BMP/CBC in one week Follow-up with GI as a scheduled Please follow up with your PCP on the following pending results: Unresulted Labs (From admission, onward)    None         Home Health: None Equipment/Devices: None  Discharge Condition: Stable CODE STATUS: Full code Diet recommendation: Regular  HPI: Ashlee Robertson is a 51 y.o. female with medical history significant of postcholecystectomy about 1 month ago complicated by choledocholithiasis requiring ERCP on 12/22 who presents to the emergency department due to 1 day onset of left-sided abdominal pain.  Patient states that she was doing well after the ERCP, but on Sunday (12/24), while sitting in the front passenger seat in a car with husband, she turned slightly towards her husband to show him something on the phone and she felt a sharp pain in the left upper quadrant which wraps towards the middle of her back, pain was sharp, persistent and was rated as 8-9/10 on pain scale.  Pain worsens with movement, pain is alleviated with rest.  Tylenol taken at home did not alleviate the pain, so she decided to go to the ED for  further evaluation and management.  She denies fever, chills, chest pain, shortness of breath, headache.  Patient denies history of alcohol use, tobacco use or any other recreational drug use.   ED Course:  In the emergency department, temperature was 97.5 F, respiratory rate 17/min, pulse 82 bpm, BP 118/52, O2 sat was 98%.  Workup in the ED showed normal CBC and BMP, albumin 3.4, AST 52, ALT 170, ALP 295, total bilirubin 1.4, lipase 35 Right upper quadrant abdominal ultrasound showed mildly enlarged and fatty liver.  Correlation with LFTs recommended to exclude steatohepatitis. CT abdomen and pelvis without contrast showed no CT etiology for acute abdominal pain identified Patient was treated with IV Dilaudid 1 mg x 3 and IV Zofran 4 mg x 3.  IV hydration was given. Gastroenterologist was consulted and will follow-up with patient on consult in the morning per ED physician.  Hospitalist was asked to admit patient for further evaluation and management.  Subjective: Seen and examined.  Husband at the bedside.  She feels much better with 1 out of 10 abdominal pain and she is tolerating regular diet.  She is now thinking that her pain might have been musculoskeletal.  Brief/Interim Summary: Return to hospital service with abdominal pain and nausea.  Intractable abdominal pain possibly of unknown etiology, elevated LFTs: Patient is status post ERCP, Patient states that she was doing well after the ERCP, but on Sunday (12/24).  Now with elevated LFTs.  Pain much better than  yesterday and she does not have any more nausea, she is tolerating regular diet, CT abdomen was unremarkable, she was also seen by GI, no further recommendations were made.  LFTs improving.  She is comfortable going home.  It looks like she may have had musculoskeletal abdominal pain.     Iron deficiency anemia Continue ferrous sulfate next   Left adnexal cyst CT abdomen pelvis without contrast showed a revisualization of a LEFT  adnexal cyst measuring up to 6.8 cm. Follow-up pelvic ultrasound in 6-12 months. If there is concern for acute ovarian torsion, recommend correlation with physical exam findings with pelvic ultrasound as deemed necessary.  Patient's abdominal pain and tenderness is mostly in the upper abdomen, clinically I doubt ovarian torsion.   GERD Continue Protonix  Discharge plan was discussed with patient and/or family member and they verbalized understanding and agreed with it.  Discharge Diagnoses:  Principal Problem:   Intractable abdominal pain Active Problems:   Iron deficiency anemia   Adnexal cyst   GERD (gastroesophageal reflux disease)   Transaminitis   Hypoalbuminemia due to protein-calorie malnutrition [facility])    Discharge Instructions   Allergies as of 11/27/2022   No Known Allergies      Medication List     TAKE these medications    esomeprazole 20 MG capsule Commonly known as: NEXIUM Take 20 mg by mouth daily.   ibuprofen 200 MG tablet Commonly known as: ADVIL Take 400 mg by mouth every 8 (eight) hours as needed (pain.).        Follow-up Information     Lawerance Cruel, MD Follow up in 1 week(s).   Specialty: Family Medicine Contact information: [id number] [place] [address] [phone]                No Known Allergies  Consultations: GI   Procedures/Studies: US Abdomen Limited RUQ (LIVER/GB)  Result Date: 11/25/2022 CLINICAL DATA:  Right upper quadrant abdominal pain. Cholecystectomy. EXAM: ULTRASOUND ABDOMEN LIMITED RIGHT UPPER QUADRANT COMPARISON:  None Available. FINDINGS: Gallbladder: Cholecystectomy. Common bile duct: Diameter: 2 mm Liver: There is diffuse increased liver echogenicity most commonly seen in the setting of fatty infiltration. Superimposed inflammation or fibrosis is not excluded. Clinical correlation is recommended. The liver is enlarged measuring 17 cm in length. Portal vein is patent on color Doppler imaging  with normal direction of blood flow towards the liver. Other: None. IMPRESSION: 1. Mildly enlarged and fatty liver. Correlation with LFTs recommended to exclude steatohepatitis. 2. Cholecystectomy. Electronically Signed   By: Anner Crete M.D.   On: 11/25/2022 21:25   CT Renal Stone Study  Result Date: 11/25/2022 CLINICAL DATA:  Abdominal/flank pain, stone suspected EXAM: CT ABDOMEN AND PELVIS WITHOUT CONTRAST TECHNIQUE: Multidetector CT imaging of the abdomen and pelvis was performed following the standard protocol without IV contrast. RADIATION DOSE REDUCTION: This exam was performed according to the departmental dose-optimization program which includes automated exposure control, adjustment of the mA and/or kV according to patient size and/or use of iterative reconstruction technique. COMPARISON:  October 30, 2022 FINDINGS: Evaluation is limited by lack of IV contrast. Lower chest: No acute abnormality. Hepatobiliary: Status post interval cholecystectomy. Unremarkable noncontrast appearance of the liver. No definitive common bile duct dilation is noted but evaluation is limited by lack of IV contrast. Pancreas: No peripancreatic fat stranding. Spleen: Unremarkable. Adrenals/Urinary Tract: Adrenal glands are unremarkable. No hydronephrosis. No nephrolithiasis or obstructing nephrolithiasis. Bladder is unremarkable. Stomach/Bowel: No evidence of bowel obstruction. Appendix is normal. Small hiatal  hernia. Vascular/Lymphatic: No significant vascular findings are present. No enlarged abdominal or pelvic lymph nodes. Reproductive: Revisualization of a LEFT adnexal cyst measuring 6.8 by 4.9 cm, previously 6.3 x 5.2 cm. Differences in measurement are likely due to differences in orientation. Uterus is unremarkable. Other: No free air or free fluid. Small amount of fat stranding near the umbilicus likely due to recent cholecystectomy port. No focal drainable fluid collection. Musculoskeletal: No acute or  significant osseous findings. IMPRESSION: 1. No CT etiology for acute abdominal pain identified. 2. Revisualization of a LEFT adnexal cyst measuring up to 6.8 cm. Recommend follow-up pelvic ultrasound in 6-12 months. If there is concern for acute ovarian torsion, recommend correlation with physical exam findings with pelvic ultrasound as deemed necessary. 3. Small amount of fat stranding near the umbilicus likely due to recent cholecystectomy port. No focal drainable fluid collection. Electronically Signed   By: Meda Klinefelter M.D.   On: 11/25/2022 14:38   DG ERCP  Result Date: 11/22/2022 CLINICAL DATA:  ERCP for choledocholithiasis. History of cholecystectomy. EXAM: ERCP TECHNIQUE: Multiple spot images obtained with the fluoroscopic device and submitted for interpretation post-procedure. COMPARISON:  MRCP-11/20/2022; CT abdomen pelvis-10/30/2022 FLUOROSCOPY TIME: FLUOROSCOPY TIME 5 minutes, 20 seconds (92.5 mGy) FINDINGS: Two spot intraoperative fluoroscopic images of the right upper abdominal quadrant during ERCP are provided for review. Images demonstrate selective cannulation and opacification of the common bile duct which appears moderately dilated. There is insufflation of a balloon within distal aspect of the CBD. There is minimal opacification of the cystic duct remnant without evidence of a bile leak. There is minimal opacification of the intrahepatic biliary tree which appears mildly dilated centrally. IMPRESSION: ERCP as above. These images were submitted for radiologic interpretation only. Please see the procedural report for the amount of contrast and the fluoroscopy time utilized. Electronically Signed   By: Simonne Come M.D.   On: 11/22/2022 12:10   MR ABDOMEN MRCP W WO CONTAST  Result Date: 11/20/2022 CLINICAL DATA:  Nausea/vomiting, status post lap cholecystectomy EXAM: MRI ABDOMEN WITHOUT AND WITH CONTRAST (INCLUDING MRCP) TECHNIQUE: Multiplanar multisequence MR imaging of the abdomen  was performed both before and after the administration of intravenous contrast. Heavily T2-weighted images of the biliary and pancreatic ducts were obtained, and three-dimensional MRCP images were rendered by post processing. CONTRAST:  8.37mL GADAVIST GADOBUTROL 1 MMOL/ML IV SOLN COMPARISON:  CT abdomen/pelvis dated 10/30/2022 FINDINGS: Lower chest: Lung bases are clear. Hepatobiliary: Liver is within normal limits. No suspicious/enhancing hepatic lesions. No hepatic steatosis. Status post cholecystectomy. No fluid collection in the gallbladder fossa. Mild central intrahepatic ductal dilatation. Dilated common duct, measuring up to 11 mm. Associated 6 mm distal CBD stone at the ampulla (series 5/image 15). Pancreas:  Within normal limits. Spleen:  Within normal limits. Adrenals/Urinary Tract:  Adrenal glands are within normal limits. Kidneys are within normal limits.  No hydronephrosis. Stomach/Bowel: Stomach is within normal limits. Visualized bowel is unremarkable. Vascular/Lymphatic:  No evidence of abdominal aortic aneurysm. No suspicious abdominal lymphadenopathy. Other:  No abdominal ascites. Musculoskeletal: No focal osseous lesions. IMPRESSION: Status post cholecystectomy. Mild intrahepatic and extrahepatic ductal dilatation. Common duct measures 11 mm. Associated 6 mm distal CBD stone at the ampulla. Consider ERCP. These results will be called to the ordering clinician or representative by the Radiologist Assistant, and communication documented in the PACS or [employer]. Electronically Signed   By: Charline Bills M.D.   On: 11/20/2022 22:55   MR 3D Recon At Scanner  Result Date: 11/20/2022 CLINICAL  DATA:  Nausea/vomiting, status post lap cholecystectomy EXAM: MRI ABDOMEN WITHOUT AND WITH CONTRAST (INCLUDING MRCP) TECHNIQUE: Multiplanar multisequence MR imaging of the abdomen was performed both before and after the administration of intravenous contrast. Heavily T2-weighted images of the  biliary and pancreatic ducts were obtained, and three-dimensional MRCP images were rendered by post processing. CONTRAST:  8.34mL GADAVIST GADOBUTROL 1 MMOL/ML IV SOLN COMPARISON:  CT abdomen/pelvis dated 10/30/2022 FINDINGS: Lower chest: Lung bases are clear. Hepatobiliary: Liver is within normal limits. No suspicious/enhancing hepatic lesions. No hepatic steatosis. Status post cholecystectomy. No fluid collection in the gallbladder fossa. Mild central intrahepatic ductal dilatation. Dilated common duct, measuring up to 11 mm. Associated 6 mm distal CBD stone at the ampulla (series 5/image 15). Pancreas:  Within normal limits. Spleen:  Within normal limits. Adrenals/Urinary Tract:  Adrenal glands are within normal limits. Kidneys are within normal limits.  No hydronephrosis. Stomach/Bowel: Stomach is within normal limits. Visualized bowel is unremarkable. Vascular/Lymphatic:  No evidence of abdominal aortic aneurysm. No suspicious abdominal lymphadenopathy. Other:  No abdominal ascites. Musculoskeletal: No focal osseous lesions. IMPRESSION: Status post cholecystectomy. Mild intrahepatic and extrahepatic ductal dilatation. Common duct measures 11 mm. Associated 6 mm distal CBD stone at the ampulla. Consider ERCP. These results will be called to the ordering clinician or representative by the Radiologist Assistant, and communication documented in the PACS or [employer]. Electronically Signed   By: Julian Hy M.D.   On: 11/20/2022 22:55   CT Abdomen Pelvis W Contrast  Result Date: 10/30/2022 CLINICAL DATA:  Abdominal pain, acute, nonlocalized EXAM: CT ABDOMEN AND PELVIS WITH CONTRAST TECHNIQUE: Multidetector CT imaging of the abdomen and pelvis was performed using the standard protocol following bolus administration of intravenous contrast. RADIATION DOSE REDUCTION: This exam was performed according to the departmental dose-optimization program which includes automated exposure control, adjustment of  the mA and/or kV according to patient size and/or use of iterative reconstruction technique. CONTRAST:  44mL OMNIPAQUE IOHEXOL 300 MG/ML  SOLN COMPARISON:  None Available. FINDINGS: Lower chest: No acute abnormality. Hepatobiliary: No focal liver abnormality. Calcified gallstones noted within the gallbladder lumen. Associated gallbladder wall thickening, pericholecystic fluid, pericholecystic fat stranding. No biliary dilatation. Pancreas: No focal lesion. Normal pancreatic contour. No surrounding inflammatory changes. No main pancreatic ductal dilatation. Spleen: Normal in size without focal abnormality. Adrenals/Urinary Tract: No adrenal nodule bilaterally. Bilateral kidneys enhance symmetrically. No hydronephrosis. No hydroureter. The urinary bladder is unremarkable. On delayed imaging, there is no urothelial wall thickening and there are no filling defects in the opacified portions of the bilateral collecting systems or ureters. Stomach/Bowel: Stomach is within normal limits. No evidence of bowel wall thickening or dilatation. Appendix appears normal. Vascular/Lymphatic: No abdominal aorta or iliac aneurysm. No abdominal, pelvic, or inguinal lymphadenopathy. Reproductive: There is a 6.3 x 5.2 cm fluid density lesion along the rectouterine pouch arising from the left ovary. Uterus and bilateral adnexa are unremarkable. Other: Trace right upper quadrant free fluid. No intraperitoneal free gas. No organized fluid collection. Musculoskeletal: No chest wall abnormality. No suspicious lytic or blastic osseous lesions. No acute displaced fracture. IMPRESSION: 1. Cholelithiasis with acute cholecystitis. Recommend surgical consultation. 2. A 6.3 cm left ovarian cyst. If patient postmenopausal: Recommend follow-up US in 6-12 months. Note: This recommendation does not apply to premenarchal patients and to those with increased risk (genetic, family history, elevated tumor markers or other high-risk factors) of ovarian  cancer. Reference: JACR 2020 Feb; 17(2):248-254 Electronically Signed   By: Iven Finn M.D.   On: 10/30/2022  17:49     Discharge Exam: Vitals:   11/27/22 0504 11/27/22 0748  BP: 105/61 (!) 108/48  Pulse: 64 74  Resp: 17 16  Temp: 98.1 F (36.7 C) 97.6 F (36.4 C)  SpO2: 97% 96%   Vitals:   11/26/22 1537 11/26/22 2118 11/27/22 0504 11/27/22 0748  BP: 120/75 103/61 105/61 (!) 108/48  Pulse: 78 73 64 74  Resp: 16 17 17 16   Temp: 97.6 F (36.4 C) 98.2 F (36.8 C) 98.1 F (36.7 C) 97.6 F (36.4 C)  TempSrc: Oral Oral Oral Oral  SpO2: 98% 97% 97% 96%  Weight:      Height:        General: Pt is alert, awake, not in acute distress Cardiovascular: RRR, S1/S2 +, no rubs, no gallops Respiratory: CTA bilaterally, no wheezing, no rhonchi Abdominal: Soft, NT, ND, bowel sounds + Extremities: no edema, no cyanosis    The results of significant diagnostics from this hospitalization (including imaging, microbiology, ancillary and laboratory) are listed below for reference.     Microbiology: No results found for this or any previous visit (from the past 240 hour(s)).   Labs: BNP (last 3 results) No results for input(s): "BNP" in the last 8760 hours. Basic Metabolic Panel: Recent Labs  Lab 11/25/22 1223 11/26/22 0934 11/27/22 0633  NA 138 135 138  K 3.7 3.5 3.3*  CL 104 104 103  CO2 28 23 27   GLUCOSE 94 108* 104*  BUN 9 8 <5*  CREATININE 0.52 0.45 0.56  CALCIUM 8.9 8.5* 8.6*   Liver Function Tests: Recent Labs  Lab 11/25/22 1223 11/26/22 0934 11/27/22 0633  AST 52* 55* 47*  ALT 170* 134* 110*  ALKPHOS 295* 239* 203*  BILITOT 1.4* 0.8 0.9  PROT 6.8 5.9* 5.4*  ALBUMIN 3.4* 3.0* 2.7*   Recent Labs  Lab 11/25/22 1223  LIPASE 35   No results for input(s): "AMMONIA" in the last 168 hours. CBC: Recent Labs  Lab 11/25/22 1223 11/26/22 0934  WBC 7.5 4.5  NEUTROABS 5.4 2.8  HGB 12.9 11.7*  HCT 39.4 36.7  MCV 91.2 91.1  PLT 366 317   Cardiac  Enzymes: No results for input(s): "CKTOTAL", "CKMB", "CKMBINDEX", "TROPONINI" in the last 168 hours. BNP: Invalid input(s): "POCBNP" CBG: No results for input(s): "GLUCAP" in the last 168 hours. D-Dimer No results for input(s): "DDIMER" in the last 72 hours. Hgb A1c No results for input(s): "HGBA1C" in the last 72 hours. Lipid Profile No results for input(s): "CHOL", "HDL", "LDLCALC", "TRIG", "CHOLHDL", "LDLDIRECT" in the last 72 hours. Thyroid function studies No results for input(s): "TSH", "T4TOTAL", "T3FREE", "THYROIDAB" in the last 72 hours.  Invalid input(s): "FREET3" Anemia work up No results for input(s): "VITAMINB12", "FOLATE", "FERRITIN", "TIBC", "IRON", "RETICCTPCT" in the last 72 hours. Urinalysis    Component Value Date/Time   COLORURINE STRAW (A) 11/25/2022 1211   APPEARANCEUR CLEAR 11/25/2022 1211   LABSPEC 1.006 11/25/2022 1211   PHURINE 5.0 11/25/2022 1211   GLUCOSEU NEGATIVE 11/25/2022 1211   HGBUR NEGATIVE 11/25/2022 1211   BILIRUBINUR NEGATIVE 11/25/2022 1211   KETONESUR NEGATIVE 11/25/2022 1211   PROTEINUR NEGATIVE 11/25/2022 1211   UROBILINOGEN 0.2 01/01/2010 0634   NITRITE NEGATIVE 11/25/2022 1211   LEUKOCYTESUR NEGATIVE 11/25/2022 1211   Sepsis Labs Recent Labs  Lab 11/25/22 1223 11/26/22 0934  WBC 7.5 4.5   Microbiology No results found for this or any previous visit (from the past 240 hour(s)).   Time coordinating discharge: Over 30 minutes  SIGNED:  Darliss Cheney, MD  [facility] 11/27/2022, 9:22 AM *Please note that this is a verbal dictation therefore any spelling or grammatical errors are due to the "[employer]" system interpretation. If 7PM-7AM, please contact night-coverage [email or web address]

## 2022-12-03 DIAGNOSIS — R7989 Other specified abnormal findings of blood chemistry: Secondary | ICD-10-CM | POA: Diagnosis not present

## 2022-12-03 DIAGNOSIS — R7401 Elevation of levels of liver transaminase levels: Secondary | ICD-10-CM | POA: Diagnosis not present

## 2022-12-10 DIAGNOSIS — R7989 Other specified abnormal findings of blood chemistry: Secondary | ICD-10-CM | POA: Diagnosis not present

## 2022-12-12 DIAGNOSIS — F419 Anxiety disorder, unspecified: Secondary | ICD-10-CM | POA: Diagnosis not present

## 2022-12-12 DIAGNOSIS — F900 Attention-deficit hyperactivity disorder, predominantly inattentive type: Secondary | ICD-10-CM | POA: Diagnosis not present

## 2022-12-12 DIAGNOSIS — Z79899 Other long term (current) drug therapy: Secondary | ICD-10-CM | POA: Diagnosis not present

## 2023-01-09 ENCOUNTER — Encounter (HOSPITAL_BASED_OUTPATIENT_CLINIC_OR_DEPARTMENT_OTHER): Payer: Self-pay | Admitting: Pharmacist

## 2023-01-09 ENCOUNTER — Other Ambulatory Visit (HOSPITAL_BASED_OUTPATIENT_CLINIC_OR_DEPARTMENT_OTHER): Payer: Self-pay

## 2023-01-09 MED ORDER — LISDEXAMFETAMINE DIMESYLATE 40 MG PO CAPS
40.0000 mg | ORAL_CAPSULE | Freq: Every day | ORAL | 0 refills | Status: DC
  Filled 2023-01-09: qty 30, 30d supply, fill #0

## 2023-01-10 ENCOUNTER — Other Ambulatory Visit (HOSPITAL_BASED_OUTPATIENT_CLINIC_OR_DEPARTMENT_OTHER): Payer: Self-pay

## 2023-02-14 ENCOUNTER — Other Ambulatory Visit (HOSPITAL_BASED_OUTPATIENT_CLINIC_OR_DEPARTMENT_OTHER): Payer: Self-pay

## 2023-02-14 MED ORDER — LISDEXAMFETAMINE DIMESYLATE 40 MG PO CAPS
40.0000 mg | ORAL_CAPSULE | Freq: Every day | ORAL | 0 refills | Status: DC
  Filled 2023-02-14: qty 30, 30d supply, fill #0

## 2023-03-11 ENCOUNTER — Other Ambulatory Visit (HOSPITAL_BASED_OUTPATIENT_CLINIC_OR_DEPARTMENT_OTHER): Payer: Self-pay

## 2023-03-11 DIAGNOSIS — Z5181 Encounter for therapeutic drug level monitoring: Secondary | ICD-10-CM | POA: Diagnosis not present

## 2023-03-11 DIAGNOSIS — Z79891 Long term (current) use of opiate analgesic: Secondary | ICD-10-CM | POA: Diagnosis not present

## 2023-03-11 DIAGNOSIS — Z79899 Other long term (current) drug therapy: Secondary | ICD-10-CM | POA: Diagnosis not present

## 2023-03-11 DIAGNOSIS — F902 Attention-deficit hyperactivity disorder, combined type: Secondary | ICD-10-CM | POA: Diagnosis not present

## 2023-03-11 MED ORDER — LISDEXAMFETAMINE DIMESYLATE 40 MG PO CAPS
40.0000 mg | ORAL_CAPSULE | Freq: Every day | ORAL | 0 refills | Status: DC
  Filled 2023-03-11: qty 30, 30d supply, fill #0
  Filled 2023-03-29: qty 20, 20d supply, fill #0

## 2023-03-27 DIAGNOSIS — N83202 Unspecified ovarian cyst, left side: Secondary | ICD-10-CM | POA: Diagnosis not present

## 2023-03-29 ENCOUNTER — Other Ambulatory Visit (HOSPITAL_BASED_OUTPATIENT_CLINIC_OR_DEPARTMENT_OTHER): Payer: Self-pay

## 2023-04-16 ENCOUNTER — Other Ambulatory Visit (HOSPITAL_BASED_OUTPATIENT_CLINIC_OR_DEPARTMENT_OTHER): Payer: Self-pay

## 2023-04-16 ENCOUNTER — Other Ambulatory Visit: Payer: Self-pay

## 2023-04-16 MED ORDER — LISDEXAMFETAMINE DIMESYLATE 40 MG PO CAPS
40.0000 mg | ORAL_CAPSULE | Freq: Every day | ORAL | 0 refills | Status: DC
  Filled 2023-04-16: qty 30, 30d supply, fill #0

## 2023-04-22 DIAGNOSIS — N83202 Unspecified ovarian cyst, left side: Secondary | ICD-10-CM | POA: Diagnosis not present

## 2023-05-07 DIAGNOSIS — Z01818 Encounter for other preprocedural examination: Secondary | ICD-10-CM | POA: Insufficient documentation

## 2023-05-07 DIAGNOSIS — N83202 Unspecified ovarian cyst, left side: Secondary | ICD-10-CM | POA: Insufficient documentation

## 2023-05-20 ENCOUNTER — Other Ambulatory Visit (HOSPITAL_BASED_OUTPATIENT_CLINIC_OR_DEPARTMENT_OTHER): Payer: Self-pay

## 2023-05-20 MED ORDER — LISDEXAMFETAMINE DIMESYLATE 40 MG PO CAPS
40.0000 mg | ORAL_CAPSULE | Freq: Every day | ORAL | 0 refills | Status: DC
  Filled 2023-05-20: qty 30, 30d supply, fill #0

## 2023-05-21 ENCOUNTER — Other Ambulatory Visit (HOSPITAL_BASED_OUTPATIENT_CLINIC_OR_DEPARTMENT_OTHER): Payer: Self-pay

## 2023-05-26 ENCOUNTER — Other Ambulatory Visit (HOSPITAL_BASED_OUTPATIENT_CLINIC_OR_DEPARTMENT_OTHER): Payer: Self-pay

## 2023-05-27 ENCOUNTER — Other Ambulatory Visit (HOSPITAL_BASED_OUTPATIENT_CLINIC_OR_DEPARTMENT_OTHER): Payer: Self-pay

## 2023-05-27 DIAGNOSIS — Z79899 Other long term (current) drug therapy: Secondary | ICD-10-CM | POA: Diagnosis not present

## 2023-05-27 DIAGNOSIS — N839 Noninflammatory disorder of ovary, fallopian tube and broad ligament, unspecified: Secondary | ICD-10-CM | POA: Diagnosis not present

## 2023-05-27 DIAGNOSIS — Z9889 Other specified postprocedural states: Secondary | ICD-10-CM | POA: Insufficient documentation

## 2023-05-27 DIAGNOSIS — Z302 Encounter for sterilization: Secondary | ICD-10-CM | POA: Diagnosis not present

## 2023-05-27 DIAGNOSIS — D271 Benign neoplasm of left ovary: Secondary | ICD-10-CM | POA: Diagnosis not present

## 2023-05-27 DIAGNOSIS — N83202 Unspecified ovarian cyst, left side: Secondary | ICD-10-CM | POA: Diagnosis not present

## 2023-05-27 DIAGNOSIS — N83209 Unspecified ovarian cyst, unspecified side: Secondary | ICD-10-CM | POA: Diagnosis not present

## 2023-05-27 MED ORDER — OXYCODONE HCL 5 MG PO TABS
5.0000 mg | ORAL_TABLET | ORAL | 0 refills | Status: DC
  Filled 2023-05-27: qty 7, 2d supply, fill #0

## 2023-05-27 MED ORDER — ACETAMINOPHEN 500 MG PO TABS
ORAL_TABLET | ORAL | 0 refills | Status: AC
  Filled 2023-05-27: qty 60, 10d supply, fill #0

## 2023-05-27 MED ORDER — IBUPROFEN 800 MG PO TABS
800.0000 mg | ORAL_TABLET | Freq: Three times a day (TID) | ORAL | 0 refills | Status: AC
  Filled 2023-05-27: qty 30, 10d supply, fill #0

## 2023-05-30 ENCOUNTER — Other Ambulatory Visit (HOSPITAL_BASED_OUTPATIENT_CLINIC_OR_DEPARTMENT_OTHER): Payer: Self-pay

## 2023-06-02 ENCOUNTER — Other Ambulatory Visit: Payer: Self-pay

## 2023-06-02 ENCOUNTER — Other Ambulatory Visit (HOSPITAL_BASED_OUTPATIENT_CLINIC_OR_DEPARTMENT_OTHER): Payer: Self-pay

## 2023-06-04 DIAGNOSIS — F902 Attention-deficit hyperactivity disorder, combined type: Secondary | ICD-10-CM | POA: Diagnosis not present

## 2023-06-04 DIAGNOSIS — Z79899 Other long term (current) drug therapy: Secondary | ICD-10-CM | POA: Diagnosis not present

## 2023-07-18 ENCOUNTER — Other Ambulatory Visit (HOSPITAL_BASED_OUTPATIENT_CLINIC_OR_DEPARTMENT_OTHER): Payer: Self-pay

## 2023-07-18 MED ORDER — LISDEXAMFETAMINE DIMESYLATE 40 MG PO CAPS
40.0000 mg | ORAL_CAPSULE | Freq: Every day | ORAL | 0 refills | Status: DC
  Filled 2023-07-18: qty 30, 30d supply, fill #0

## 2023-09-03 ENCOUNTER — Other Ambulatory Visit (HOSPITAL_BASED_OUTPATIENT_CLINIC_OR_DEPARTMENT_OTHER): Payer: Self-pay

## 2023-09-03 DIAGNOSIS — Z79899 Other long term (current) drug therapy: Secondary | ICD-10-CM | POA: Diagnosis not present

## 2023-09-03 DIAGNOSIS — F902 Attention-deficit hyperactivity disorder, combined type: Secondary | ICD-10-CM | POA: Diagnosis not present

## 2023-09-03 MED ORDER — LISDEXAMFETAMINE DIMESYLATE 40 MG PO CAPS
40.0000 mg | ORAL_CAPSULE | Freq: Every day | ORAL | 0 refills | Status: DC
  Filled 2023-09-03: qty 30, 30d supply, fill #0

## 2023-10-08 ENCOUNTER — Ambulatory Visit (INDEPENDENT_AMBULATORY_CARE_PROVIDER_SITE_OTHER): Payer: BC Managed Care – PPO | Admitting: Podiatry

## 2023-10-08 DIAGNOSIS — L600 Ingrowing nail: Secondary | ICD-10-CM

## 2023-10-15 ENCOUNTER — Ambulatory Visit: Payer: Self-pay | Admitting: Podiatry

## 2023-10-15 ENCOUNTER — Ambulatory Visit (INDEPENDENT_AMBULATORY_CARE_PROVIDER_SITE_OTHER): Payer: BC Managed Care – PPO | Admitting: Nurse Practitioner

## 2023-10-15 ENCOUNTER — Encounter: Payer: Self-pay | Admitting: Nurse Practitioner

## 2023-10-15 VITALS — BP 137/78 | HR 94 | Temp 96.8°F | Resp 16 | Ht 65.0 in | Wt 204.6 lb

## 2023-10-15 DIAGNOSIS — Z23 Encounter for immunization: Secondary | ICD-10-CM

## 2023-10-15 DIAGNOSIS — Z1211 Encounter for screening for malignant neoplasm of colon: Secondary | ICD-10-CM | POA: Diagnosis not present

## 2023-10-15 DIAGNOSIS — Z13220 Encounter for screening for lipoid disorders: Secondary | ICD-10-CM | POA: Diagnosis not present

## 2023-10-15 DIAGNOSIS — E66811 Obesity, class 1: Secondary | ICD-10-CM

## 2023-10-15 DIAGNOSIS — N951 Menopausal and female climacteric states: Secondary | ICD-10-CM

## 2023-10-15 DIAGNOSIS — Z0000 Encounter for general adult medical examination without abnormal findings: Secondary | ICD-10-CM

## 2023-10-15 DIAGNOSIS — R221 Localized swelling, mass and lump, neck: Secondary | ICD-10-CM

## 2023-10-15 DIAGNOSIS — Z1329 Encounter for screening for other suspected endocrine disorder: Secondary | ICD-10-CM

## 2023-10-15 MED ORDER — WEGOVY 0.25 MG/0.5ML ~~LOC~~ SOAJ: 0.2500 mg | SUBCUTANEOUS | 2 refills | Status: DC

## 2023-10-15 NOTE — Progress Notes (Signed)
Subjective   Patient ID: Ashlee Robertson, female    DOB: [birth year], 52 y.o.   MRN: [id number]  Chief Complaint  Patient presents with   Establish Care    Referring provider: Daisy Floro, MD  Ashlee Robertson is a 52 y.o. female with Past Medical History: No date: Varicose veins   HPI  Patient presents today to establish care.  She states that she does have a history of adult ADHD and is on Vyvanse.  She gets this prescribed to [facility].  Patient has not followed with PCP in a couple years.  She would like to have labs done.  She does complain of swelling to the left side of her neck.  She states that this has been an issue for the past year.  We will order ultrasound.  Patient is also concerned about her weight.  She is requesting to start her on Wegovy.  We discussed that this may not be covered by her insurance but we will try to order this today.      No Known Allergies  Immunization History  Administered Date(s) Administered   Influenza, Seasonal, Injecte, Preservative Fre 10/15/2023   Zoster Recombinant(Shingrix) 10/15/2023    Tobacco History: Social History   Tobacco Use  Smoking Status Never  Smokeless Tobacco Never   Counseling given: Not Answered   Outpatient Encounter Medications as of 10/15/2023  Medication Sig   acetaminophen (TYLENOL) 500 MG tablet Take 2 tablets (1,000 mg total) by mouth every 8 (eight) hours for 10 days.   esomeprazole (NEXIUM) 20 MG capsule Take 20 mg by mouth daily.   ibuprofen (ADVIL) 200 MG tablet Take 400 mg by mouth every 8 (eight) hours as needed (pain.).   ibuprofen (ADVIL) 800 MG tablet Take 1 tablet (800 mg total) by mouth every 8 (eight) hours for 10 days.   lisdexamfetamine (VYVANSE) 40 MG capsule Take 1 capsule (40 mg total) by mouth daily.   Semaglutide-Weight Management (WEGOVY) 0.25 MG/0.5ML SOAJ Inject 0.25 mg into the skin once a week.   [DISCONTINUED] lisdexamfetamine (VYVANSE) 40 MG  capsule Take 1 capsule (40 mg total) by mouth daily.   [DISCONTINUED] lisdexamfetamine (VYVANSE) 40 MG capsule Take 1 capsule (40 mg total) by mouth daily.   [DISCONTINUED] lisdexamfetamine (VYVANSE) 40 MG capsule Take 1 capsule (40 mg total) by mouth daily.   [DISCONTINUED] lisdexamfetamine (VYVANSE) 40 MG capsule Take 1 capsule (40 mg total) by mouth daily.   oxyCODONE (OXY IR/ROXICODONE) 5 MG immediate release tablet Take 1 tablet (5 mg total) by mouth every 4 (four) hours as needed for moderate pain (4-6) for up to 5 days.   No facility-administered encounter medications on file as of 10/15/2023.    Review of Systems  Review of Systems  Constitutional: Negative.   HENT: Negative.    Cardiovascular: Negative.   Gastrointestinal: Negative.   Allergic/Immunologic: Negative.   Neurological: Negative.   Psychiatric/Behavioral: Negative.       Objective:   BP 137/78   Pulse 94   Temp (!) 96.8 F (36 C) (Tympanic)   Resp 16   Ht 5\' 5"  (1.651 m)   Wt 204 lb 9.6 oz (92.8 kg)   SpO2 99%   BMI 34.05 kg/m   Wt Readings from Last 5 Encounters:  10/15/23 204 lb 9.6 oz (92.8 kg)  11/25/22 185 lb (83.9 kg)  11/22/22 185 lb (83.9 kg)  10/30/22 190 lb 0.6 oz (86.2 kg)  07/02/15 190 lb (86.2 kg)  Physical Exam Vitals and nursing note reviewed.  Constitutional:      General: She is not in acute distress.    Appearance: She is well-developed.  Cardiovascular:     Rate and Rhythm: Normal rate and regular rhythm.  Pulmonary:     Effort: Pulmonary effort is normal.     Breath sounds: Normal breath sounds.  Neurological:     Mental Status: She is alert and oriented to person, place, and time.       Assessment & Plan:   Lipid screening -     Lipid panel; Future  Routine adult health maintenance -     CBC; Future -     Comprehensive metabolic panel; Future  Thyroid disorder screen -     TSH; Future  Colon cancer screening -     Cologuard  Obesity (BMI  30.0-34.9) -     [id number]; Inject 0.25 mg into the skin once a week.  Dispense: 2 mL; Refill: 2  Menopausal symptoms -     Estrogens, total; Future -     Follicle stimulating hormone; Future  Localized swelling, mass and lump, neck -     US THYROID; Future  Encounter for immunization -     Flu vaccine trivalent PF, 6mos and older(Flulaval,Afluria,Fluarix,Fluzone)  Immunization due -     Varicella-zoster vaccine IM     Return in about 4 weeks (around 11/12/2023) for weight.   Ivonne Andrew, NP 10/15/2023

## 2023-10-15 NOTE — Patient Instructions (Addendum)
1. Lipid screening  - Lipid Panel; Future  2. Routine adult health maintenance  - CBC; Future - Comprehensive metabolic panel; Future  3. Thyroid disorder screen  - TSH; Future  4. Colon cancer screening  - Cologuard   Follow up:  Follow up in 1 year for physical

## 2023-10-17 ENCOUNTER — Telehealth: Payer: Self-pay

## 2023-10-17 NOTE — Telephone Encounter (Signed)
[facility] is needing clarification on recent ultrasound order they want to know exactly what you are looking for.

## 2023-10-20 NOTE — Telephone Encounter (Signed)
Is this just a thyroid concern or should they also do soft tissues and well for swelling. Please advise [facility]

## 2023-10-20 NOTE — Progress Notes (Signed)
   Chief Complaint  Patient presents with   Ingrown Toenail    Left foot ingrown big toe X 2 months    Subjective: Patient presents today for evaluation of pain to the left great toenail plate. Patient is concerned for possible ingrown nail.  It is very sensitive to touch.  Patient presents today for further treatment and evaluation.  Past Medical History:  Diagnosis Date   Varicose veins     Past Surgical History:  Procedure Laterality Date   ABLATION     uterine   CHOLECYSTECTOMY N/A 10/31/2022   Procedure: LAPAROSCOPIC CHOLECYSTECTOMY;  Surgeon: Axel Filler, MD;  Location: [facility] OR;  Service: General;  Laterality: N/A;   CYSTOSCOPY     ERCP N/A 11/22/2022   Procedure: ENDOSCOPIC RETROGRADE CHOLANGIOPANCREATOGRAPHY (ERCP);  Surgeon: Vida Rigger, MD;  Location: [facility] ENDOSCOPY;  Service: Gastroenterology;  Laterality: N/A;   laproscopy     ectopic preg   REMOVAL OF STONES  11/22/2022   Procedure: REMOVAL OF STONES;  Surgeon: Vida Rigger, MD;  Location: [facility] ENDOSCOPY;  Service: Gastroenterology;;   sacrospinous hysteropexy     anterior aproach   SPHINCTEROTOMY  11/22/2022   Procedure: SPHINCTEROTOMY;  Surgeon: Vida Rigger, MD;  Location: [facility] ENDOSCOPY;  Service: Gastroenterology;;    No Known Allergies  Objective:  General: Well developed, nourished, in no acute distress, alert and oriented x3   Dermatology: Skin is warm, dry and supple bilateral.  Left great toenail plate is tender with evidence of an ingrowing nail. Pain on palpation noted to the border of the nail fold. The remaining nails appear unremarkable at this time.   Vascular: DP and PT pulses palpable.  No clinical evidence of vascular compromise  Neruologic: Grossly intact via light touch bilateral.  Musculoskeletal: No pedal deformity noted  Assesement: #1  Pain associated to an ingrown toenail left hallux nail plate  Plan of Care:  -Patient evaluated.  -Discussed treatment alternatives and plan of care.  Explained nail avulsion procedure and post procedure course to patient as well as conservative care which would include simple debridement of the nail plate.  Patient is leaving out of town and so today we will opt for conservative debridement of the offending border of the nail plate to see if this alleviates her symptoms. - Mechanical debridement of the offending border of the nail plate was performed today using a tissue nipper and nail nipper as a courtesy for the patient without incident.  Patient felt significant relief -Return to clinic as needed  Felecia Shelling, DPM [facility]  Dr. Felecia Shelling, DPM    [address] [place], [place] [address]                Office [phone]  Fax [phone]

## 2023-10-21 NOTE — Telephone Encounter (Signed)
Called and advised [facility]

## 2023-10-23 ENCOUNTER — Other Ambulatory Visit: Payer: BC Managed Care – PPO

## 2023-10-23 ENCOUNTER — Other Ambulatory Visit (HOSPITAL_COMMUNITY): Payer: Self-pay | Admitting: Nurse Practitioner

## 2023-10-23 ENCOUNTER — Other Ambulatory Visit: Payer: Self-pay

## 2023-10-23 DIAGNOSIS — E66811 Obesity, class 1: Secondary | ICD-10-CM

## 2023-10-23 DIAGNOSIS — N951 Menopausal and female climacteric states: Secondary | ICD-10-CM

## 2023-10-23 DIAGNOSIS — Z13220 Encounter for screening for lipoid disorders: Secondary | ICD-10-CM | POA: Diagnosis not present

## 2023-10-23 DIAGNOSIS — Z1329 Encounter for screening for other suspected endocrine disorder: Secondary | ICD-10-CM

## 2023-10-23 DIAGNOSIS — Z0000 Encounter for general adult medical examination without abnormal findings: Secondary | ICD-10-CM

## 2023-10-23 DIAGNOSIS — R221 Localized swelling, mass and lump, neck: Secondary | ICD-10-CM

## 2023-10-23 MED ORDER — WEGOVY 0.25 MG/0.5ML ~~LOC~~ SOAJ
0.2500 mg | SUBCUTANEOUS | 2 refills | Status: DC
  Filled 2023-10-23: qty 2, 28d supply, fill #0

## 2023-10-23 NOTE — Telephone Encounter (Signed)
Caller & Relationship to patient:  MRN #  [id number]   Call Back Number:   Date of Last Office Visit: 10/17/2023     Date of Next Office Visit: 10/23/2023    Medication(s) to be Refilled: Wegovy to another pharmacy  Preferred Pharmacy: [facility]  ** Please notify patient to allow 48-72 hours to process** **Let patient know to contact pharmacy at the end of the day to make sure medication is ready. ** **If patient has not been seen in a year or longer, book an appointment **Advise to use MyChart for refill requests OR to contact their pharmacy

## 2023-10-24 ENCOUNTER — Ambulatory Visit: Payer: Self-pay | Admitting: Nurse Practitioner

## 2023-10-24 ENCOUNTER — Other Ambulatory Visit: Payer: Self-pay

## 2023-10-24 LAB — COMPREHENSIVE METABOLIC PANEL
ALT: 28 [IU]/L (ref 0–32)
AST: 21 [IU]/L (ref 0–40)
Albumin: 3.9 g/dL (ref 3.8–4.9)
Alkaline Phosphatase: 96 [IU]/L (ref 44–121)
BUN/Creatinine Ratio: 12 (ref 9–23)
BUN: 7 mg/dL (ref 6–24)
Bilirubin Total: 0.3 mg/dL (ref 0.0–1.2)
CO2: 26 mmol/L (ref 20–29)
Calcium: 9 mg/dL (ref 8.7–10.2)
Chloride: 104 mmol/L (ref 96–106)
Creatinine, Ser: 0.59 mg/dL (ref 0.57–1.00)
Globulin, Total: 2.6 g/dL (ref 1.5–4.5)
Glucose: 91 mg/dL (ref 70–99)
Potassium: 4.1 mmol/L (ref 3.5–5.2)
Sodium: 143 mmol/L (ref 134–144)
Total Protein: 6.5 g/dL (ref 6.0–8.5)
eGFR: 107 mL/min/{1.73_m2} (ref >59)

## 2023-10-24 LAB — FOLLICLE STIMULATING HORMONE: FSH: 44.6 m[IU]/mL

## 2023-10-24 LAB — LIPID PANEL
Chol/HDL Ratio: 4.1 ratio (ref 0.0–4.4)
Cholesterol, Total: 227 mg/dL — ABNORMAL HIGH (ref 100–199)
HDL: 56 mg/dL (ref >39)
LDL Chol Calc (NIH): 155 mg/dL — ABNORMAL HIGH (ref 0–99)
Triglycerides: 90 mg/dL (ref 0–149)
VLDL Cholesterol Cal: 16 mg/dL (ref 5–40)

## 2023-10-24 LAB — CBC
Hematocrit: 41.1 % (ref 34.0–46.6)
Hemoglobin: 13.3 g/dL (ref 11.1–15.9)
MCH: 29 pg (ref 26.6–33.0)
MCHC: 32.4 g/dL (ref 31.5–35.7)
MCV: 90 fL (ref 79–97)
Platelets: 369 10*3/uL (ref 150–450)
RBC: 4.59 x10E6/uL (ref 3.77–5.28)
RDW: 12.5 % (ref 11.7–15.4)
WBC: 6.8 10*3/uL (ref 3.4–10.8)

## 2023-10-24 LAB — TSH: TSH: 0.725 u[IU]/mL (ref 0.450–4.500)

## 2023-10-24 NOTE — Telephone Encounter (Signed)
  Chief Complaint: returning phone call from clinic for abnormal lab value Disposition: [] ED /[] Urgent Care (no appt availability in office) / [] Appointment(In office/virtual)/ []  [place] [facility]/ [x] Home Care/ [] Refused Recommended Disposition /[] [place] [facility]/ []  Follow-up with PCP Additional Notes: Pt advised of abnormal lab results and education. Pt confirms she understands. Home care.  Copied from CRM [id number]. Topic: Quick Communication - Lab Results (Clinic Use ONLY) >> Oct 24, 2023  2:19 PM Yvonna Alanis B wrote: Called patient to inform them of lab results. When patient returns call, triage nurse may disclose results. Reason for Disposition  General information question, no triage required and triager able to answer question  Answer Assessment - Initial Assessment Questions 1. REASON FOR CALL or QUESTION: "What is your reason for calling today?" or "How can I best help you?" or "What question do you have that I can help answer?"     Returning call VM, abnormal labs.  Protocols used: Information Only Call - No Triage-A-AH

## 2023-10-26 LAB — ESTROGENS, TOTAL: Estrogen: 58 pg/mL

## 2023-10-27 DIAGNOSIS — Z1211 Encounter for screening for malignant neoplasm of colon: Secondary | ICD-10-CM | POA: Diagnosis not present

## 2023-11-03 LAB — COLOGUARD: COLOGUARD: NEGATIVE

## 2023-11-05 ENCOUNTER — Ambulatory Visit: Payer: BC Managed Care – PPO | Admitting: Nurse Practitioner

## 2023-11-10 ENCOUNTER — Other Ambulatory Visit (HOSPITAL_BASED_OUTPATIENT_CLINIC_OR_DEPARTMENT_OTHER): Payer: Self-pay

## 2023-11-10 MED ORDER — LISDEXAMFETAMINE DIMESYLATE 40 MG PO CAPS
40.0000 mg | ORAL_CAPSULE | Freq: Every day | ORAL | 0 refills | Status: DC
  Filled 2023-11-10: qty 30, 30d supply, fill #0

## 2023-11-12 ENCOUNTER — Encounter: Payer: Self-pay | Admitting: Nurse Practitioner

## 2023-11-12 ENCOUNTER — Other Ambulatory Visit (HOSPITAL_BASED_OUTPATIENT_CLINIC_OR_DEPARTMENT_OTHER): Payer: Self-pay

## 2023-11-12 ENCOUNTER — Ambulatory Visit (INDEPENDENT_AMBULATORY_CARE_PROVIDER_SITE_OTHER): Payer: BC Managed Care – PPO | Admitting: Nurse Practitioner

## 2023-11-12 VITALS — BP 112/63 | HR 82 | Temp 97.0°F | Wt 194.2 lb

## 2023-11-12 DIAGNOSIS — E66811 Obesity, class 1: Secondary | ICD-10-CM

## 2023-11-12 MED ORDER — WEGOVY 0.5 MG/0.5ML ~~LOC~~ SOAJ
0.5000 mg | SUBCUTANEOUS | 2 refills | Status: DC
  Filled 2023-11-12: qty 2, fill #0

## 2023-11-12 MED ORDER — WEGOVY 0.5 MG/0.5ML ~~LOC~~ SOAJ
0.5000 mg | SUBCUTANEOUS | 2 refills | Status: DC
  Filled 2023-11-12: qty 2, 28d supply, fill #0
  Filled 2023-12-06: qty 2, 28d supply, fill #1
  Filled 2024-01-14: qty 2, 28d supply, fill #2

## 2023-11-12 NOTE — Progress Notes (Signed)
Subjective   Patient ID: Ashlee Robertson, female    DOB: [birth year], 52 y.o.   MRN: [id number]  Chief Complaint  Patient presents with   Weight Check    Wegovy follow up     Referring provider: Daisy Floro, MD  Ashlee Robertson is a 52 y.o. female with Past Medical History: No date: Varicose veins   HPI  Patient presents today for a follow-up for weight.  She was started on Wegovy at last visit.  Weight at last visit was 204 pounds in weight this visit is 194 pounds.  She states that she has noticed decrease in appetite and is eating smaller portions.  Overall tolerating medication well.  We will increase dosage today. Denies f/c/s, n/v/d, hemoptysis, PND, leg swelling Denies chest pain or edema    No Known Allergies  Immunization History  Administered Date(s) Administered   Influenza, Seasonal, Injecte, Preservative Fre 10/15/2023   Zoster Recombinant(Shingrix) 10/15/2023    Tobacco History: Social History   Tobacco Use  Smoking Status Never  Smokeless Tobacco Never   Counseling given: Not Answered   Outpatient Encounter Medications as of 11/12/2023  Medication Sig   acetaminophen (TYLENOL) 500 MG tablet Take 2 tablets (1,000 mg total) by mouth every 8 (eight) hours for 10 days.   esomeprazole (NEXIUM) 20 MG capsule Take 20 mg by mouth daily.   ibuprofen (ADVIL) 200 MG tablet Take 400 mg by mouth every 8 (eight) hours as needed (pain.).   lisdexamfetamine (VYVANSE) 40 MG capsule Take 1 capsule (40 mg total) by mouth daily.   [DISCONTINUED] Semaglutide-Weight Management (WEGOVY) 0.25 MG/0.5ML SOAJ Inject 0.25 mg into the skin once a week.   [DISCONTINUED] Semaglutide-Weight Management (WEGOVY) 0.5 MG/0.5ML SOAJ Inject 0.5 mg into the skin once a week.   ibuprofen (ADVIL) 800 MG tablet Take 1 tablet (800 mg total) by mouth every 8 (eight) hours for 10 days. (Patient not taking: Reported on 11/12/2023)   lisdexamfetamine (VYVANSE) 40 MG capsule Take 1 capsule  (40 mg total) by mouth daily.   oxyCODONE (OXY IR/ROXICODONE) 5 MG immediate release tablet Take 1 tablet (5 mg total) by mouth every 4 (four) hours as needed for moderate pain (4-6) for up to 5 days. (Patient not taking: Reported on 11/12/2023)   Semaglutide-Weight Management (WEGOVY) 0.5 MG/0.5ML SOAJ Inject 0.5 mg into the skin once a week.   No facility-administered encounter medications on file as of 11/12/2023.    Review of Systems  Review of Systems  Constitutional: Negative.   HENT: Negative.    Cardiovascular: Negative.   Gastrointestinal: Negative.   Allergic/Immunologic: Negative.   Neurological: Negative.   Psychiatric/Behavioral: Negative.       Objective:   BP 112/63   Pulse 82   Temp (!) 97 F (36.1 C)   Wt 194 lb 3.2 oz (88.1 kg)   SpO2 97%   BMI 32.32 kg/m   Wt Readings from Last 5 Encounters:  11/12/23 194 lb 3.2 oz (88.1 kg)  10/15/23 204 lb 9.6 oz (92.8 kg)  11/25/22 185 lb (83.9 kg)  11/22/22 185 lb (83.9 kg)  10/30/22 190 lb 0.6 oz (86.2 kg)     Physical Exam Vitals and nursing note reviewed.  Constitutional:      General: She is not in acute distress.    Appearance: She is well-developed.  Cardiovascular:     Rate and Rhythm: Normal rate and regular rhythm.  Pulmonary:     Effort: Pulmonary effort is normal.  Breath sounds: Normal breath sounds.  Neurological:     Mental Status: She is alert and oriented to person, place, and time.       Assessment & Plan:   Obesity (BMI 30.0-34.9) -     XBJYNW; Inject 0.5 mg into the skin once a week.  Dispense: 2 mL; Refill: 2     Return in about 3 months (around 02/10/2024).     Ivonne Andrew, NP 11/12/2023

## 2023-11-12 NOTE — Patient Instructions (Addendum)
1. Obesity (BMI 30.0-34.9)   - Semaglutide-Weight Management (WEGOVY) 0.5 MG/0.5ML SOAJ; Inject 0.5 mg into the skin once a week.  Dispense: 2 mL; Refill: 2   Follow up:  Follow up in 3 months

## 2023-12-04 ENCOUNTER — Other Ambulatory Visit: Payer: Self-pay

## 2023-12-04 ENCOUNTER — Telehealth: Payer: Self-pay

## 2023-12-04 NOTE — Telephone Encounter (Signed)
 Pharmacy Patient Advocate Encounter   Received notification from CoverMyMeds that prior authorization for WEGOVY  is required/requested.   Insurance verification completed.   The patient is insured through [employer] [facility] .   Per test claim: PA required; PA submitted to above mentioned insurance via CoverMyMeds Key/confirmation #/EOC [id number] Status is pending

## 2023-12-08 ENCOUNTER — Other Ambulatory Visit (HOSPITAL_BASED_OUTPATIENT_CLINIC_OR_DEPARTMENT_OTHER): Payer: Self-pay

## 2023-12-08 DIAGNOSIS — Z79899 Other long term (current) drug therapy: Secondary | ICD-10-CM | POA: Diagnosis not present

## 2023-12-08 DIAGNOSIS — F902 Attention-deficit hyperactivity disorder, combined type: Secondary | ICD-10-CM | POA: Diagnosis not present

## 2023-12-08 MED ORDER — LISDEXAMFETAMINE DIMESYLATE 40 MG PO CAPS
40.0000 mg | ORAL_CAPSULE | Freq: Every day | ORAL | 0 refills | Status: DC
  Filled 2023-12-08 – 2023-12-19 (×2): qty 30, 30d supply, fill #0

## 2023-12-11 ENCOUNTER — Other Ambulatory Visit (HOSPITAL_BASED_OUTPATIENT_CLINIC_OR_DEPARTMENT_OTHER): Payer: Self-pay

## 2023-12-12 ENCOUNTER — Other Ambulatory Visit (HOSPITAL_BASED_OUTPATIENT_CLINIC_OR_DEPARTMENT_OTHER): Payer: Self-pay

## 2023-12-15 ENCOUNTER — Other Ambulatory Visit (HOSPITAL_BASED_OUTPATIENT_CLINIC_OR_DEPARTMENT_OTHER): Payer: Self-pay

## 2023-12-15 DIAGNOSIS — Z79899 Other long term (current) drug therapy: Secondary | ICD-10-CM | POA: Diagnosis not present

## 2023-12-15 DIAGNOSIS — F900 Attention-deficit hyperactivity disorder, predominantly inattentive type: Secondary | ICD-10-CM | POA: Diagnosis not present

## 2023-12-18 ENCOUNTER — Other Ambulatory Visit (HOSPITAL_BASED_OUTPATIENT_CLINIC_OR_DEPARTMENT_OTHER): Payer: Self-pay

## 2023-12-19 ENCOUNTER — Other Ambulatory Visit (HOSPITAL_BASED_OUTPATIENT_CLINIC_OR_DEPARTMENT_OTHER): Payer: Self-pay

## 2023-12-19 ENCOUNTER — Other Ambulatory Visit: Payer: Self-pay

## 2023-12-19 NOTE — Telephone Encounter (Signed)
Pharmacy Patient Advocate Encounter   Received notification from CoverMyMeds that prior authorization for [facility] is required/requested.   Insurance verification completed.   The patient is insured through [employer] [facility] .   Per test claim: PA required; PA submitted to above mentioned insurance via CoverMyMeds Key/confirmation #/EOC [id number] Status is pending  **NEW REQUEST INITIATED PER [employer] [facility]

## 2023-12-22 ENCOUNTER — Other Ambulatory Visit: Payer: Self-pay

## 2023-12-22 NOTE — Telephone Encounter (Signed)
Pharmacy Patient Advocate Encounter  Received notification from [employer] [facility] that Prior Authorization for [facility] has been APPROVED from 12/19/2023 to 07/18/2024   PA #/Case ID/Reference #: [id number]

## 2024-01-15 ENCOUNTER — Other Ambulatory Visit (HOSPITAL_BASED_OUTPATIENT_CLINIC_OR_DEPARTMENT_OTHER): Payer: Self-pay

## 2024-02-05 ENCOUNTER — Other Ambulatory Visit (HOSPITAL_BASED_OUTPATIENT_CLINIC_OR_DEPARTMENT_OTHER): Payer: Self-pay

## 2024-02-05 DIAGNOSIS — R509 Fever, unspecified: Secondary | ICD-10-CM | POA: Diagnosis not present

## 2024-02-05 DIAGNOSIS — J0300 Acute streptococcal tonsillitis, unspecified: Secondary | ICD-10-CM | POA: Diagnosis not present

## 2024-02-05 DIAGNOSIS — Z03818 Encounter for observation for suspected exposure to other biological agents ruled out: Secondary | ICD-10-CM | POA: Diagnosis not present

## 2024-02-05 MED ORDER — LIDOCAINE VISCOUS HCL 2 % MT SOLN
5.0000 mL | OROMUCOSAL | 0 refills | Status: AC | PRN
  Filled 2024-02-05: qty 100, 2d supply, fill #0

## 2024-02-05 MED ORDER — AMOXICILLIN 500 MG PO CAPS
500.0000 mg | ORAL_CAPSULE | Freq: Two times a day (BID) | ORAL | 0 refills | Status: AC
  Filled 2024-02-05: qty 20, 10d supply, fill #0

## 2024-02-11 ENCOUNTER — Encounter: Payer: Self-pay | Admitting: Nurse Practitioner

## 2024-02-11 ENCOUNTER — Ambulatory Visit (INDEPENDENT_AMBULATORY_CARE_PROVIDER_SITE_OTHER): Payer: Self-pay | Admitting: Nurse Practitioner

## 2024-02-11 VITALS — BP 109/60 | HR 96 | Temp 97.0°F | Wt 194.0 lb

## 2024-02-11 DIAGNOSIS — Z0289 Encounter for other administrative examinations: Secondary | ICD-10-CM

## 2024-02-11 DIAGNOSIS — E66811 Obesity, class 1: Secondary | ICD-10-CM | POA: Diagnosis not present

## 2024-02-11 MED ORDER — WEGOVY 1 MG/0.5ML ~~LOC~~ SOAJ: 1.0000 mg | SUBCUTANEOUS | 2 refills | Status: DC

## 2024-02-11 NOTE — Patient Instructions (Signed)
 1. Obesity (BMI 30.0-34.9) (Primary)  - CBC - Comprehensive metabolic panel - Amb Ref to Medical Weight Management  Follow up:  Follow up in 1 month

## 2024-02-11 NOTE — Progress Notes (Signed)
 Subjective   Patient ID: Ashlee Robertson, female    DOB: 13-Apr-1971, 53 y.o.   MRN: [id number]  Chief Complaint  Patient presents with   Hyperlipidemia   Weight Check    Referring provider: Ivonne Andrew, NP  Ashlee Robertson is a 53 y.o. female with Past Medical History: No date: Varicose veins   HPI  Patient presents today for a follow-up for weight.  She was started on Wegovy at last visit.  Weight at last visit was 204 pounds in weight this visit is 194 pounds.  This is same as last visit. Will increase dosage to 1 mg. She states that she has noticed decrease in appetite and is eating smaller portions.  Overall tolerating medication well.  We will increase dosage today. Denies f/c/s, n/v/d, hemoptysis, PND, leg swelling Denies chest pain or edema.     No Known Allergies  Immunization History  Administered Date(s) Administered   Influenza, Seasonal, Injecte, Preservative Fre 10/15/2023   Zoster Recombinant(Shingrix) 10/15/2023    Tobacco History: Social History   Tobacco Use  Smoking Status Never  Smokeless Tobacco Never   Counseling given: Not Answered   Outpatient Encounter Medications as of 02/11/2024  Medication Sig   acetaminophen (TYLENOL) 500 MG tablet Take 2 tablets (1,000 mg total) by mouth every 8 (eight) hours for 10 days.   amoxicillin (AMOXIL) 500 MG capsule Take 1 capsule (500 mg total) by mouth every 12 (twelve) hours for 10 days.   esomeprazole (NEXIUM) 20 MG capsule Take 20 mg by mouth daily.   ibuprofen (ADVIL) 200 MG tablet Take 400 mg by mouth every 8 (eight) hours as needed (pain.).   Semaglutide-Weight Management (WEGOVY) 0.5 MG/0.5ML SOAJ Inject 0.5 mg into the skin once a week.   Semaglutide-Weight Management (WEGOVY) 1 MG/0.5ML SOAJ Inject 1 mg into the skin once a week.   ibuprofen (ADVIL) 800 MG tablet Take 1 tablet (800 mg total) by mouth every 8 (eight) hours for 10 days. (Patient not taking: Reported on 02/11/2024)   lidocaine  (XYLOCAINE) 2 % solution Gargle and spit 5-10 mLs in the mouth or throat every 3 (three) to 4 (four) hours as needed. (Patient not taking: Reported on 02/11/2024)   lisdexamfetamine (VYVANSE) 40 MG capsule Take 1 capsule (40 mg total) by mouth daily. (Patient not taking: Reported on 02/11/2024)   lisdexamfetamine (VYVANSE) 40 MG capsule Take 1 capsule (40 mg total) by mouth daily. (Patient not taking: Reported on 02/11/2024)   oxyCODONE (OXY IR/ROXICODONE) 5 MG immediate release tablet Take 1 tablet (5 mg total) by mouth every 4 (four) hours as needed for moderate pain (4-6) for up to 5 days. (Patient not taking: Reported on 02/11/2024)   No facility-administered encounter medications on file as of 02/11/2024.    Review of Systems  Review of Systems  Constitutional: Negative.   HENT: Negative.    Cardiovascular: Negative.   Gastrointestinal: Negative.   Allergic/Immunologic: Negative.   Neurological: Negative.   Psychiatric/Behavioral: Negative.       Objective:   BP 109/60   Pulse 96   Temp (!) 97 F (36.1 C)   Wt 194 lb (88 kg)   SpO2 98%   BMI 32.28 kg/m   Wt Readings from Last 5 Encounters:  02/11/24 194 lb (88 kg)  11/12/23 194 lb 3.2 oz (88.1 kg)  10/15/23 204 lb 9.6 oz (92.8 kg)  11/25/22 185 lb (83.9 kg)  11/22/22 185 lb (83.9 kg)     Physical Exam  Vitals and nursing note reviewed.  Constitutional:      General: She is not in acute distress.    Appearance: She is well-developed.  Cardiovascular:     Rate and Rhythm: Normal rate and regular rhythm.  Pulmonary:     Effort: Pulmonary effort is normal.     Breath sounds: Normal breath sounds.  Neurological:     Mental Status: She is alert and oriented to person, place, and time.       Assessment & Plan:   Obesity (BMI 30.0-34.9) -     CBC -     Comprehensive metabolic panel -     Amb Ref to Medical Weight Management  Other orders -     ZWCHEN; Inject 1 mg into the skin once a week.  Dispense: 2 mL;  Refill: 2     Return in about 4 weeks (around 03/10/2024) for weight.   Ivonne Andrew, NP 02/11/2024

## 2024-02-12 ENCOUNTER — Ambulatory Visit (INDEPENDENT_AMBULATORY_CARE_PROVIDER_SITE_OTHER): Admitting: Nurse Practitioner

## 2024-02-12 ENCOUNTER — Encounter: Payer: Self-pay | Admitting: Nurse Practitioner

## 2024-02-12 VITALS — BP 114/73 | HR 80 | Temp 97.8°F | Ht 65.5 in | Wt 192.0 lb

## 2024-02-12 DIAGNOSIS — E538 Deficiency of other specified B group vitamins: Secondary | ICD-10-CM

## 2024-02-12 DIAGNOSIS — E66811 Obesity, class 1: Secondary | ICD-10-CM

## 2024-02-12 DIAGNOSIS — E559 Vitamin D deficiency, unspecified: Secondary | ICD-10-CM

## 2024-02-12 DIAGNOSIS — E6609 Other obesity due to excess calories: Secondary | ICD-10-CM

## 2024-02-12 DIAGNOSIS — Z6831 Body mass index (BMI) 31.0-31.9, adult: Secondary | ICD-10-CM

## 2024-02-12 NOTE — Progress Notes (Signed)
 Office: [phone]  /  Fax: [phone]   Initial Visit  Ashlee Robertson was seen in clinic today to evaluate for obesity. She is interested in losing weight to improve overall health and reduce the risk of weight related complications. She presents today to review program treatment options, initial physical assessment, and evaluation.     She was referred by: PCP  When asked what else they would like to accomplish? She states: Adopt healthier eating patterns, Improve quality of life, Improve appearance, and Improve self-confidence  Weight history:  When asked how has your weight affected you? She states: Contributed to orthopedic problems or mobility issues, Having fatigue, and Having poor endurance  Some associated conditions: varicose veins, GERD, iron def anemia,   Contributing factors: Family history of obesity and Menopause  Weight promoting medications identified: None  Current nutrition plan: None  Current level of physical activity: None  Current or previous pharmacotherapy: Wegovy prescribed by PCP-started Dec 2024-recently increased to 1mg - WEGOVY has been APPROVED from 12/19/2023 to 07/18/2024 -starting weight with Wegovy was 204 lbs.    Response to medication:  Occ side effects of GI upset   Past medical history includes:   Past Medical History:  Diagnosis Date   Varicose veins      Objective:   BP 114/73   Pulse 80   Temp 97.8 F (36.6 C)   Ht 5' 5.5" (1.664 m)   Wt 192 lb (87.1 kg)   SpO2 97%   BMI 31.46 kg/m  She was weighed on the bioimpedance scale: Body mass index is 31.46 kg/m.  Peak Weight:200 , Body Fat%:41.1, Visceral Fat Rating:10, Weight trend over the last 12 months: Decreasing  General:  Alert, oriented and cooperative. Patient is in no acute distress.  Respiratory: Normal respiratory effort, no problems with respiration noted   Gait: able to ambulate independently  Mental Status: Normal mood and affect. Normal behavior. Normal  judgment and thought content.   DIAGNOSTIC DATA REVIEWED:  BMET    Component Value Date/Time   NA 143 10/23/2023 0936   K 4.1 10/23/2023 0936   CL 104 10/23/2023 0936   CO2 26 10/23/2023 0936   GLUCOSE 91 10/23/2023 0936   GLUCOSE 104 (H) 11/27/2022 0633   BUN 7 10/23/2023 0936   CREATININE 0.59 10/23/2023 0936   CALCIUM 9.0 10/23/2023 0936   GFRNONAA >60 11/27/2022 0633   GFRAA >60 07/02/2015 0042   No results found for: "HGBA1C" No results found for: "INSULIN" CBC    Component Value Date/Time   WBC 6.8 10/23/2023 0935   WBC 4.5 11/26/2022 0934   RBC 4.59 10/23/2023 0935   RBC 4.03 11/26/2022 0934   HGB 13.3 10/23/2023 0935   HCT 41.1 10/23/2023 0935   PLT 369 10/23/2023 0935   MCV 90 10/23/2023 0935   MCH 29.0 10/23/2023 0935   MCH 29.0 11/26/2022 0934   MCHC 32.4 10/23/2023 0935   MCHC 31.9 11/26/2022 0934   RDW 12.5 10/23/2023 0935   Iron/TIBC/Ferritin/ %Sat No results found for: "IRON", "TIBC", "FERRITIN", "IRONPCTSAT" Lipid Panel     Component Value Date/Time   CHOL 227 (H) 10/23/2023 0936   TRIG 90 10/23/2023 0936   HDL 56 10/23/2023 0936   CHOLHDL 4.1 10/23/2023 0936   LDLCALC 155 (H) 10/23/2023 0936   Hepatic Function Panel     Component Value Date/Time   PROT 6.5 10/23/2023 0936   ALBUMIN 3.9 10/23/2023 0936   AST 21 10/23/2023 0936   ALT 28 10/23/2023 0936  ALKPHOS 96 10/23/2023 0936   BILITOT 0.3 10/23/2023 0936      Component Value Date/Time   TSH 0.725 10/23/2023 0937     Assessment and Plan:   Vitamin B12 deficiency Will obtain labs at next visit  Vitamin D deficiency Will obtain labs at next visit  Class 1 obesity due to excess calories without serious comorbidity with body mass index (BMI) of 31.0 to 31.9 in adult        Obesity Treatment / Action Plan:  Patient will work on garnering support from family and friends to begin weight loss journey. Will work on eliminating or reducing the presence of highly palatable,  calorie dense foods in the home. Will complete provided nutritional and psychosocial assessment questionnaire before the next appointment. Will be scheduled for indirect calorimetry to determine resting energy expenditure in a fasting state.  This will allow [place] to create a reduced calorie, high-protein meal plan to promote loss of fat mass while preserving muscle mass. Counseled on the health benefits of losing 5%-15% of total body weight. Was counseled on nutritional approaches to weight loss and benefits of reducing processed foods and consuming plant-based foods and high quality protein as part of nutritional weight management. Was counseled on pharmacotherapy and role as an adjunct in weight management.   Obesity Education Performed Today:  She was weighed on the bioimpedance scale and results were discussed and documented in the synopsis.  We discussed obesity as a disease and the importance of a more detailed evaluation of all the factors contributing to the disease.  We discussed the importance of long term lifestyle changes which include nutrition, exercise and behavioral modifications as well as the importance of customizing this to her specific health and social needs.  We discussed the benefits of reaching a healthier weight to alleviate the symptoms of existing conditions and reduce the risks of the biomechanical, metabolic and psychological effects of obesity.  Ashlee Robertson appears to be in the action stage of change and states they are ready to start intensive lifestyle modifications and behavioral modifications.  30 minutes was spent today on this visit including the above counseling, pre-visit chart review, and post-visit documentation.  Reviewed by clinician on day of visit: allergies, medications, problem list, medical history, surgical history, family history, social history, and previous encounter notes pertinent to obesity diagnosis.    Ashlee Sato Hedwig Mcfall FNP-C

## 2024-02-18 ENCOUNTER — Other Ambulatory Visit (HOSPITAL_BASED_OUTPATIENT_CLINIC_OR_DEPARTMENT_OTHER): Payer: Self-pay

## 2024-02-18 ENCOUNTER — Other Ambulatory Visit: Payer: Self-pay

## 2024-02-18 MED ORDER — WEGOVY 1 MG/0.5ML ~~LOC~~ SOAJ
1.0000 mg | SUBCUTANEOUS | 2 refills | Status: DC
  Filled 2024-02-18 – 2024-02-24 (×5): qty 2, 28d supply, fill #0

## 2024-02-20 ENCOUNTER — Other Ambulatory Visit (HOSPITAL_BASED_OUTPATIENT_CLINIC_OR_DEPARTMENT_OTHER): Payer: Self-pay

## 2024-02-20 MED ORDER — LISDEXAMFETAMINE DIMESYLATE 40 MG PO CAPS
40.0000 mg | ORAL_CAPSULE | Freq: Every day | ORAL | 0 refills | Status: AC
  Filled 2024-02-20: qty 30, 30d supply, fill #0

## 2024-02-23 ENCOUNTER — Encounter: Payer: Self-pay | Admitting: Bariatrics

## 2024-02-23 ENCOUNTER — Ambulatory Visit (INDEPENDENT_AMBULATORY_CARE_PROVIDER_SITE_OTHER): Admitting: Bariatrics

## 2024-02-23 ENCOUNTER — Other Ambulatory Visit (HOSPITAL_BASED_OUTPATIENT_CLINIC_OR_DEPARTMENT_OTHER): Payer: Self-pay

## 2024-02-23 VITALS — BP 105/70 | HR 81 | Temp 97.8°F | Ht 65.5 in | Wt 193.0 lb

## 2024-02-23 DIAGNOSIS — E669 Obesity, unspecified: Secondary | ICD-10-CM | POA: Diagnosis not present

## 2024-02-23 DIAGNOSIS — Z1331 Encounter for screening for depression: Secondary | ICD-10-CM | POA: Diagnosis not present

## 2024-02-23 DIAGNOSIS — R5383 Other fatigue: Secondary | ICD-10-CM

## 2024-02-23 DIAGNOSIS — E66811 Obesity, class 1: Secondary | ICD-10-CM

## 2024-02-23 DIAGNOSIS — Z0000 Encounter for general adult medical examination without abnormal findings: Secondary | ICD-10-CM | POA: Diagnosis not present

## 2024-02-23 DIAGNOSIS — E6609 Other obesity due to excess calories: Secondary | ICD-10-CM

## 2024-02-23 DIAGNOSIS — E559 Vitamin D deficiency, unspecified: Secondary | ICD-10-CM

## 2024-02-23 DIAGNOSIS — E538 Deficiency of other specified B group vitamins: Secondary | ICD-10-CM | POA: Diagnosis not present

## 2024-02-23 DIAGNOSIS — R0602 Shortness of breath: Secondary | ICD-10-CM | POA: Diagnosis not present

## 2024-02-23 DIAGNOSIS — Z6831 Body mass index (BMI) 31.0-31.9, adult: Secondary | ICD-10-CM | POA: Diagnosis not present

## 2024-02-23 NOTE — Progress Notes (Signed)
 At a Glance:  Vitals Temp: 97.8 F (36.6 C) BP: 105/70 Pulse Rate: 81 SpO2: 97 %   Anthropometric Measurements Height: 5' 5.5" (1.664 m) Weight: 193 lb (87.5 kg) BMI (Calculated): 31.62 Starting Weight: 193lb Peak Weight: 200lb Waist Measurement : 45 inches  sssssss Body Composition  Body Fat %: 41.6 % Fat Mass (lbs): 80.4 lbs Muscle Mass (lbs): 107.2 lbs Total Body Water (lbs): 73.6 lbs Visceral Fat Rating : 10   Other Clinical Data RMR: 1771 Fasting: yes Labs: yes Today's Visit #: 1 Starting Date: 02/23/24    EKG: Normal sinus rhythm, rate 80.  Indirect Calorimeter:   Resting Metabolic Rate ( RMR):  RMR (actual): 1771 kcal RMR (calculated): 1562 kcal The calculated basal metabolic rate is 4098 kcal thus her basal metabolic rate is better than expected.  Plan:   Indirect calorimeter completed, interpreted and reviewed with patient today and allowed to ask questions.  Discussed the implications for the chosen plan and exercise based on the RMR reading.  Will consider repeating the RMR in the future based on weight loss.    Chief Complaint:  Obesity   Subjective:  JOHNATHAN TORTORELLI (MR# [id number]) is a 53 y.o. female who presents for evaluation and treatment of obesity and related comorbidities.   Rakayla is currently in the action stage of change and ready to dedicate time achieving and maintaining a healthier weight. Marykathleen is interested in becoming our patient and working on intensive lifestyle modifications including (but not limited to) diet and exercise for weight loss.  Brilynn has been struggling with her weight. She has been unsuccessful in either losing weight, maintaining weight loss, or reaching her healthy weight goal.  Maymie's habits were reviewed today and are as follows: Her family eats meals together, she thinks her family will eat healthier with her, she started gaining weight years ago, and she has significant food cravings  issues.   Current or previous pharmacotherapy: Wegovy prescribed by PCP-started Dec 2024-recently increased to 1 mg. WEGOVY has been APPROVED from 12/19/2023 to 07/18/2024 -starting weight with Wegovy was 204 lbs.   Response to medication: occasional GI upset.   Other Fatigue Secret admits to daytime somnolence and admits to waking up still tired.  Jadin generally gets 6 or 7 hours of sleep per night, and states that she has generally restful sleep. Snoring is present. Apneic episodes is not present. Epworth Sleepiness Score is 1.   Shortness of Breath Viann notes increasing shortness of breath with exercising and seems to be worsening over time with weight gain. She notes getting out of breath sooner with activity than she used to. This has not gotten worse recently. Raja denies shortness of breath at rest or orthopnea.  Depression Screen [employer]'s Food and Mood (modified PHQ-9) score was 11. 10-14 moderate depression     10/15/2023    9:24 AM  Depression screen PHQ 2/9  Decreased Interest 0  Down, Depressed, Hopeless 0  PHQ - 2 Score 0     Assessment and Plan:   Other Fatigue Ragan does feel that her weight is causing her energy to be lower than it should be. Fatigue may be related to obesity, depression or many other causes. Labs will be ordered, and in the meanwhile, Lennette will focus on self care including making healthy food choices, increasing physical activity and focusing on stress reduction.  Shortness of Breath Dorothyann does not feel that she gets out of breath more easily that she used to when she  exercises. Berlie's shortness of breath appears to be obesity related and exercise induced. She has agreed to work on weight loss and gradually increase exercise to treat her exercise induced shortness of breath. Will continue to monitor closely.  Health Maintenance:   Obesity   Plan: Will do EKG, indirect calorimetry, and labs.     Vitamin D Deficiency Vitamin D is not at goal  of 50.  Most recent vitamin D level was low.  She is at risk for vitamin D deficiency due to obesity.  She is not on vitamin D at this time.    Plan: Will check for vitamin D deficiency.    B 12 deficiency:   She not taking B12 (oral/sublingual). She has a history of B12 deficiency. .   Plan:  Check B 12 lab today.    Annmarie had a positive depression screening. Depression is commonly associated with obesity and often results in emotional eating behaviors. We will monitor this closely and work on CBT to help improve the non-hunger eating patterns. Referral to Psychology may be required if no improvement is seen as she continues in our clinic.   Previous labs reviewed today. Date: 10/23/23 CMP, Lipids, and glucose  Labs done today CMP, Lipids, Insulin, HgbA1c, Vit D, Vit B12, and Thyroid Panel   Generalized Obesity: BMI (Calculated): 31.62   Deedra is currently in the action stage of change and her goal is to begin weight loss efforts. I recommend Maya begin the structured treatment plan as follows:  She has agreed to Category 2 Plan  Exercise goals: All adults should avoid inactivity. Some activity is better than none, and adults who participate in any amount of physical activity, gain some health benefits.  Behavioral modification strategies:increasing lean protein intake, decreasing simple carbohydrates, increase H2O intake, no skipping meals, keeping healthy foods in the home, better snacking choices, avoiding temptations, and planning for success  She was informed of the importance of frequent follow-up visits to maximize her success with intensive lifestyle modifications for her multiple health conditions. She was informed we would discuss her lab results at her next visit unless there is a critical issue that needs to be addressed sooner. Murray agreed to keep her next visit at the agreed upon time to discuss these results.  Objective:  General: Cooperative, alert, well  developed, in no acute distress. HEENT: Conjunctivae and lids unremarkable. Cardiovascular: Regular rhythm.  Lungs: Normal work of breathing. Neurologic: No focal deficits.   Lab Results  Component Value Date   CREATININE 0.59 10/23/2023   BUN 7 10/23/2023   NA 143 10/23/2023   K 4.1 10/23/2023   CL 104 10/23/2023   CO2 26 10/23/2023   Lab Results  Component Value Date   ALT 28 10/23/2023   AST 21 10/23/2023   ALKPHOS 96 10/23/2023   BILITOT 0.3 10/23/2023   No results found for: "HGBA1C" No results found for: "INSULIN" Lab Results  Component Value Date   TSH 0.725 10/23/2023   Lab Results  Component Value Date   CHOL 227 (H) 10/23/2023   HDL 56 10/23/2023   LDLCALC 155 (H) 10/23/2023   TRIG 90 10/23/2023   CHOLHDL 4.1 10/23/2023   Lab Results  Component Value Date   WBC 6.8 10/23/2023   HGB 13.3 10/23/2023   HCT 41.1 10/23/2023   MCV 90 10/23/2023   PLT 369 10/23/2023    Attestation Statements:  Applicable history such as the following:  allergies, medications, problem list, medical history, surgical history,  family history, social history, and previous encounter notes reviewed by clinician on day of visit:   Time spent on visit including the items listed below was 42 minutes.  -preparing to see the patient (e.g., review of tests, history, previous notes) -obtaining and/or reviewing separately obtained history -counseling and educating the patient/family/caregiver -documenting clinical information in the electronic or other health record -ordering medications, tests, or procedures -independently interpreting results and communicating results to the patient/ family/caregiver -referring and communicating with other health care professionals  -care coordination   This may have been prepared with the assistance of Engineer, civil (consulting).  Occasional wrong-word or sound-a-like substitutions may have occurred due to the inherent limitations of voice  recognition software.    Corinna Capra, DO

## 2024-02-24 ENCOUNTER — Encounter: Payer: Self-pay | Admitting: Bariatrics

## 2024-02-24 ENCOUNTER — Other Ambulatory Visit (HOSPITAL_BASED_OUTPATIENT_CLINIC_OR_DEPARTMENT_OTHER): Payer: Self-pay

## 2024-02-24 DIAGNOSIS — E7800 Pure hypercholesterolemia, unspecified: Secondary | ICD-10-CM | POA: Insufficient documentation

## 2024-02-24 DIAGNOSIS — E785 Hyperlipidemia, unspecified: Secondary | ICD-10-CM | POA: Insufficient documentation

## 2024-02-24 DIAGNOSIS — R7303 Prediabetes: Secondary | ICD-10-CM | POA: Insufficient documentation

## 2024-02-24 DIAGNOSIS — E559 Vitamin D deficiency, unspecified: Secondary | ICD-10-CM | POA: Insufficient documentation

## 2024-02-24 LAB — LIPID PANEL WITH LDL/HDL RATIO
Cholesterol, Total: 239 mg/dL — ABNORMAL HIGH (ref 100–199)
HDL: 64 mg/dL (ref >39)
LDL Chol Calc (NIH): 154 mg/dL — ABNORMAL HIGH (ref 0–99)
LDL/HDL Ratio: 2.4 ratio (ref 0.0–3.2)
Triglycerides: 121 mg/dL (ref 0–149)
VLDL Cholesterol Cal: 21 mg/dL (ref 5–40)

## 2024-02-24 LAB — TSH+T4F+T3FREE
Free T4: 1.11 ng/dL (ref 0.82–1.77)
T3, Free: 3.3 pg/mL (ref 2.0–4.4)
TSH: 0.501 u[IU]/mL (ref 0.450–4.500)

## 2024-02-24 LAB — VITAMIN D 25 HYDROXY (VIT D DEFICIENCY, FRACTURES): Vit D, 25-Hydroxy: 27.2 ng/mL — ABNORMAL LOW (ref 30.0–100.0)

## 2024-02-24 LAB — INSULIN, RANDOM: INSULIN: 10.7 u[IU]/mL (ref 2.6–24.9)

## 2024-02-24 LAB — COMPREHENSIVE METABOLIC PANEL
ALT: 27 IU/L (ref 0–32)
AST: 23 IU/L (ref 0–40)
Albumin: 4.2 g/dL (ref 3.8–4.9)
Alkaline Phosphatase: 90 IU/L (ref 44–121)
BUN/Creatinine Ratio: 10 (ref 9–23)
BUN: 6 mg/dL (ref 6–24)
Bilirubin Total: 0.3 mg/dL (ref 0.0–1.2)
CO2: 25 mmol/L (ref 20–29)
Calcium: 9 mg/dL (ref 8.7–10.2)
Chloride: 104 mmol/L (ref 96–106)
Creatinine, Ser: 0.6 mg/dL (ref 0.57–1.00)
Globulin, Total: 2.5 g/dL (ref 1.5–4.5)
Glucose: 86 mg/dL (ref 70–99)
Potassium: 4.6 mmol/L (ref 3.5–5.2)
Sodium: 140 mmol/L (ref 134–144)
Total Protein: 6.7 g/dL (ref 6.0–8.5)
eGFR: 107 mL/min/{1.73_m2} (ref >59)

## 2024-02-24 LAB — HEMOGLOBIN A1C
Est. average glucose Bld gHb Est-mCnc: 117 mg/dL
Hgb A1c MFr Bld: 5.7 % — ABNORMAL HIGH (ref 4.8–5.6)

## 2024-02-24 LAB — VITAMIN B12: Vitamin B-12: 490 pg/mL (ref 232–1245)

## 2024-03-08 ENCOUNTER — Other Ambulatory Visit (HOSPITAL_BASED_OUTPATIENT_CLINIC_OR_DEPARTMENT_OTHER): Payer: Self-pay

## 2024-03-08 ENCOUNTER — Encounter: Payer: Self-pay | Admitting: Bariatrics

## 2024-03-08 ENCOUNTER — Ambulatory Visit (INDEPENDENT_AMBULATORY_CARE_PROVIDER_SITE_OTHER): Admitting: Bariatrics

## 2024-03-08 VITALS — BP 121/80 | HR 86 | Temp 98.1°F | Ht 65.5 in | Wt 186.0 lb

## 2024-03-08 DIAGNOSIS — R7303 Prediabetes: Secondary | ICD-10-CM | POA: Diagnosis not present

## 2024-03-08 DIAGNOSIS — E559 Vitamin D deficiency, unspecified: Secondary | ICD-10-CM | POA: Diagnosis not present

## 2024-03-08 DIAGNOSIS — E66811 Obesity, class 1: Secondary | ICD-10-CM

## 2024-03-08 DIAGNOSIS — E7800 Pure hypercholesterolemia, unspecified: Secondary | ICD-10-CM

## 2024-03-08 DIAGNOSIS — Z6830 Body mass index (BMI) 30.0-30.9, adult: Secondary | ICD-10-CM

## 2024-03-08 DIAGNOSIS — E785 Hyperlipidemia, unspecified: Secondary | ICD-10-CM | POA: Diagnosis not present

## 2024-03-08 DIAGNOSIS — E669 Obesity, unspecified: Secondary | ICD-10-CM

## 2024-03-08 MED ORDER — WEGOVY 1 MG/0.5ML ~~LOC~~ SOAJ
1.0000 mg | SUBCUTANEOUS | 2 refills | Status: DC
  Filled 2024-03-08 – 2024-03-18 (×2): qty 2, 28d supply, fill #0

## 2024-03-08 MED ORDER — VITAMIN D (ERGOCALCIFEROL) 1.25 MG (50000 UNIT) PO CAPS
50000.0000 [IU] | ORAL_CAPSULE | ORAL | 0 refills | Status: DC
  Filled 2024-03-08 – 2024-03-18 (×2): qty 4, 28d supply, fill #0

## 2024-03-08 NOTE — Progress Notes (Signed)
 First follow-up after initial visit.        WEIGHT SUMMARY AND BIOMETRICS  Weight Lost Since Last Visit: 7lb  Weight Gained Since Last Visit: 0   Vitals Temp: 98.1 F (36.7 C) BP: 121/80 Pulse Rate: 86 SpO2: 97 %   Anthropometric Measurements Height: 5' 5.5" (1.664 m) Weight: 186 lb (84.4 kg) BMI (Calculated): 30.47 Weight at Last Visit: 193lb Weight Lost Since Last Visit: 7lb Weight Gained Since Last Visit: 0 Starting Weight: 193lb Total Weight Loss (lbs): 7 lb (3.175 kg) Peak Weight: 200lb   Body Composition  Body Fat %: 40 % Fat Mass (lbs): 74.6 lbs Muscle Mass (lbs): 106.4 lbs Total Body Water (lbs): 70.2 lbs Visceral Fat Rating : 10   Other Clinical Data Fasting: no Labs: no Today's Visit #: 2 Starting Date: 02/23/24    OBESITY Ashlee Robertson is here to discuss her progress with her obesity treatment plan along with follow-up of her obesity related diagnoses.    Nutrition Plan: the Category 2 plan - 100% adherence.  Current exercise: walking  Interim History:  She is down 7 lbs since her initial visit.  Eating all of the food on the plan., Protein intake is as prescribed, Water intake is adequate., and Denies excessive cravings.  Initial positives regarding the dietary plan: plan worked well.  Initial challenges regarding  the dietary plan: " a lot of food"   Pharmacotherapy: Denelle is on Wegovy 1.0 mg SQ weekly Adverse side effects: None Hunger is well controlled.  Cravings are well controlled.  Assessment/Plan:   Prediabetes Last A1c was 5.7  Medication(s): Wegovy 1.0 mg SQ weekly Lab Results  Component Value Date   HGBA1C 5.7 (H) 02/23/2024   Lab Results  Component Value Date   INSULIN 10.7 02/23/2024    Plan: Information sheet on " Insulin Resistance and Prediabetes".  Will minimize all refined carbohydrates both  sweets and starches.  Will work on the plan and exercise.  Consider both aerobic and resistance training.  Will keep protein, water, and fiber intake high.  Increase Polyunsaturated and Monounsaturated fats to increase satiety and encourage weight loss.  Aim for 7 to 9 hours of sleep nightly.  Given and discussed new sheet on "Eating Out".  Given additional information for lunch and dinner options.  Wegovy 1.0 mg SQ weekly 1 month supply with no refills.   Vitamin D Deficiency Vitamin D is not at goal of 50.  Most recent vitamin D level was 27.2. She is not on vitamin D. Lab Results  Component Value Date   VD25OH 27.2 (L) 02/23/2024    Plan: Begin  prescription vitamin D 50,000 IU weekly.   Hyperlipidemia LDL is not at goal. Medication(s): None Cardiovascular risk factors: obesity (BMI >= 30 kg/m2) and sedentary lifestyle  Lab Results  Component Value Date   CHOL 239 (H) 02/23/2024   HDL 64 02/23/2024   LDLCALC 154 (H) 02/23/2024   TRIG 121 02/23/2024   CHOLHDL 4.1 10/23/2023   Lab Results  Component Value Date   ALT 27 02/23/2024   AST 23 02/23/2024   ALKPHOS 90 02/23/2024   BILITOT 0.3 02/23/2024   The 10-year ASCVD risk score (Arnett DK, et al., 2019) is: 1.7%   Values used to calculate the score:     Age: 21 years     Sex: Female     Is Non-Hispanic African American: No     Diabetic: No     Tobacco smoker: No     Systolic  Blood Pressure: 121 mmHg     Is BP treated: No     HDL Cholesterol: 64 mg/dL     Total Cholesterol: 239 mg/dL  Plan:  She will continue her walking regimen.  Will avoid all trans fats.  Will read labels Will minimize saturated fats except the following: low fat meats in moderation, diary, and limited dark chocolate.  Increase Omega 3 in foods, and consider an Omega 3 supplement.  She will only eat out occasionally.     Generalized Obesity: Current BMI BMI (Calculated): 30.47   Pharmacotherapy Plan Continue and increase dose   Wegovy 1.0 mg SQ weekly  Jenavie is currently in the action stage of change. As such, her goal is to continue with weight loss efforts.  She has agreed to the Category 2 plan.  Exercise goals: All adults should avoid inactivity. Some physical activity is better than none, and adults who participate in any amount of physical activity gain some health benefits.  Behavioral modification strategies: increasing lean protein intake, decreasing simple carbohydrates , no meal skipping, decrease eating out, meal planning , increase water intake, better snacking choices, and planning for success.  Lisia has agreed to follow-up with our clinic in 2 weeks.    Labs reviewed today from last visit (CMP, Lipids, HgbA1c, insulin, vitamin D, B 12, and thyroid panel).   Objective:   VITALS: Per patient if applicable, see vitals. GENERAL: Alert and in no acute distress. CARDIOPULMONARY: No increased WOB. Speaking in clear sentences.  PSYCH: Pleasant and cooperative. Speech normal rate and rhythm. Affect is appropriate. Insight and judgement are appropriate. Attention is focused, linear, and appropriate.  NEURO: Oriented as arrived to appointment on time with no prompting.   Attestation Statements:   This was prepared with the assistance of Engineer, civil (consulting).  Occasional wrong-word or sound-a-like substitutions may have occurred due to the inherent limitations of voice recognition software.   Corinna Capra, DO

## 2024-03-17 ENCOUNTER — Ambulatory Visit: Payer: Self-pay | Admitting: Nurse Practitioner

## 2024-03-18 ENCOUNTER — Other Ambulatory Visit (HOSPITAL_BASED_OUTPATIENT_CLINIC_OR_DEPARTMENT_OTHER): Payer: Self-pay

## 2024-03-22 ENCOUNTER — Ambulatory Visit (INDEPENDENT_AMBULATORY_CARE_PROVIDER_SITE_OTHER): Admitting: Bariatrics

## 2024-03-22 ENCOUNTER — Encounter: Payer: Self-pay | Admitting: Bariatrics

## 2024-03-22 ENCOUNTER — Other Ambulatory Visit (HOSPITAL_BASED_OUTPATIENT_CLINIC_OR_DEPARTMENT_OTHER): Payer: Self-pay

## 2024-03-22 VITALS — BP 99/66 | HR 72 | Temp 97.7°F | Ht 65.5 in | Wt 183.0 lb

## 2024-03-22 DIAGNOSIS — E669 Obesity, unspecified: Secondary | ICD-10-CM | POA: Diagnosis not present

## 2024-03-22 DIAGNOSIS — Z6829 Body mass index (BMI) 29.0-29.9, adult: Secondary | ICD-10-CM | POA: Diagnosis not present

## 2024-03-22 DIAGNOSIS — R632 Polyphagia: Secondary | ICD-10-CM

## 2024-03-22 DIAGNOSIS — E559 Vitamin D deficiency, unspecified: Secondary | ICD-10-CM

## 2024-03-22 MED ORDER — VITAMIN D (ERGOCALCIFEROL) 1.25 MG (50000 UNIT) PO CAPS
50000.0000 [IU] | ORAL_CAPSULE | ORAL | 0 refills | Status: DC
  Filled 2024-03-22: qty 5, 35d supply, fill #0

## 2024-03-22 MED ORDER — WEGOVY 1 MG/0.5ML ~~LOC~~ SOAJ
1.0000 mg | SUBCUTANEOUS | 2 refills | Status: DC
  Filled 2024-03-22: qty 2, 28d supply, fill #0

## 2024-03-22 NOTE — Progress Notes (Signed)
 WEIGHT SUMMARY AND BIOMETRICS  Weight Lost Since Last Visit: 3lb  Weight Gained Since Last Visit: 0   Vitals Temp: 97.7 F (36.5 C) BP: 99/66 Pulse Rate: 72 SpO2: 99 %   Anthropometric Measurements Height: 5' 5.5" (1.664 m) Weight: 183 lb (83 kg) BMI (Calculated): 29.98 Weight at Last Visit: 186lb Weight Lost Since Last Visit: 3lb Weight Gained Since Last Visit: 0 Starting Weight: 193lb Total Weight Loss (lbs): 10 lb (4.536 kg) Peak Weight: 200lb   Body Composition  Body Fat %: 39 % Fat Mass (lbs): 71.4 lbs Muscle Mass (lbs): 106.2 lbs Total Body Water (lbs): 69.2 lbs Visceral Fat Rating : 9   Other Clinical Data Fasting: no Labs: no Today's Visit #: 3 Starting Date: 02/23/24    OBESITY Ashlee Robertson is here to discuss her progress with her obesity treatment plan along with follow-up of her obesity related diagnoses.    Nutrition Plan: the Category 2 plan - 90-95% adherence.  Current exercise: walking  Interim History:  She is down 3 lbs since her last visit.  Eating all of the food on the plan., Is not skipping meals, Not journaling consistently., Water intake is adequate., and Denies polyphagia   Pharmacotherapy: Ashlee Robertson is on Wegovy  1.0 mg SQ weekly Adverse side effects: None Hunger is moderately controlled.  Cravings are moderately controlled.  Assessment/Plan:   Vitamin D  Deficiency Vitamin D  is not at goal of 50.  Most recent vitamin D  level was 27.2. Her fatigue is improving.  She is on  prescription ergocalciferol  50,000 IU weekly. Lab Results  Component Value Date   VD25OH 27.2 (L) 02/23/2024    Plan: Refill prescription vitamin D  50,000 IU weekly.  She will get some minimal sun exposure.   Polyphagia Ashlee Robertson endorses excessive hunger.  Medication(s): Wegovy  Effects of medication:  moderately controlled. Cravings are moderately  controlled.   Plan: Medication(s): Wegovy  1.0 mg SQ weekly Will increase water, protein and fiber to help assuage hunger.  Will minimize foods that have a high glucose index/load to minimize reactive hypoglycemia.  Will continue to journal her food.  She will increase her exercise and add in some weights.      Generalized Obesity: Current BMI BMI (Calculated): 29.98   Pharmacotherapy Plan Continue and refill  Wegovy  1.0 mg SQ weekly  Ashlee Robertson is currently in the action stage of change. As such, her goal is to continue with weight loss efforts.  She has agreed to the Category 2 plan.  Exercise goals: Adults should also include muscle-strengthening activities that involve all major muscle groups on 2 or more days a week.  Behavioral modification strategies: increasing lean protein intake, decreasing simple carbohydrates , meal planning , increase water intake, better snacking choices, planning for success, increasing vegetables, increasing fiber rich foods, decrease snacking , avoiding temptations, keep healthy foods in the home, and weigh protein portions.  Ashlee Robertson has agreed to follow-up with our clinic in 2 weeks.    Objective:   VITALS: Per patient if applicable, see vitals. GENERAL: Alert and in no acute distress. CARDIOPULMONARY: No increased WOB. Speaking in clear sentences.  PSYCH: Pleasant and cooperative. Speech normal rate and rhythm. Affect is appropriate. Insight and judgement are appropriate. Attention is focused, linear, and appropriate.  NEURO: Oriented as arrived to appointment on time with no prompting.   Attestation Statements:   This was prepared with the assistance of Engineer, civil (consulting).  Occasional wrong-word or sound-a-like substitutions may have occurred due to the inherent limitations of voice recognition   Kirk Peper, DO

## 2024-04-05 ENCOUNTER — Ambulatory Visit (INDEPENDENT_AMBULATORY_CARE_PROVIDER_SITE_OTHER): Admitting: Bariatrics

## 2024-04-05 ENCOUNTER — Other Ambulatory Visit (HOSPITAL_BASED_OUTPATIENT_CLINIC_OR_DEPARTMENT_OTHER): Payer: Self-pay

## 2024-04-05 ENCOUNTER — Other Ambulatory Visit: Payer: Self-pay

## 2024-04-05 ENCOUNTER — Encounter: Payer: Self-pay | Admitting: Bariatrics

## 2024-04-05 VITALS — BP 108/71 | HR 82 | Temp 97.6°F | Ht 65.5 in | Wt 180.0 lb

## 2024-04-05 DIAGNOSIS — R632 Polyphagia: Secondary | ICD-10-CM | POA: Diagnosis not present

## 2024-04-05 DIAGNOSIS — E669 Obesity, unspecified: Secondary | ICD-10-CM

## 2024-04-05 DIAGNOSIS — E559 Vitamin D deficiency, unspecified: Secondary | ICD-10-CM

## 2024-04-05 DIAGNOSIS — Z6829 Body mass index (BMI) 29.0-29.9, adult: Secondary | ICD-10-CM | POA: Diagnosis not present

## 2024-04-05 MED ORDER — VITAMIN D (ERGOCALCIFEROL) 1.25 MG (50000 UNIT) PO CAPS
50000.0000 [IU] | ORAL_CAPSULE | ORAL | 0 refills | Status: DC
  Filled 2024-04-05: qty 5, 35d supply, fill #0

## 2024-04-05 MED ORDER — WEGOVY 1.7 MG/0.75ML ~~LOC~~ SOAJ
1.7000 mg | SUBCUTANEOUS | 0 refills | Status: DC
  Filled 2024-04-05 – 2024-04-19 (×2): qty 3, 28d supply, fill #0

## 2024-04-05 NOTE — Progress Notes (Signed)
 WEIGHT SUMMARY AND BIOMETRICS  Weight Lost Since Last Visit: 3lb  Weight Gained Since Last Visit: 0   Vitals Temp: 97.6 F (36.4 C) BP: 108/71 Pulse Rate: 82 SpO2: 99 %   Anthropometric Measurements Height: 5' 5.5" (1.664 m) Weight: 180 lb (81.6 kg) BMI (Calculated): 29.49 Weight at Last Visit: 183lb Weight Lost Since Last Visit: 3lb Weight Gained Since Last Visit: 0 Starting Weight: 193lb Total Weight Loss (lbs): 13 lb (5.897 kg) Peak Weight: 200lb   Body Composition  Body Fat %: 38.5 % Fat Mass (lbs): 69.6 lbs Muscle Mass (lbs): 105.4 lbs Total Body Water (lbs): 67.4 lbs Visceral Fat Rating : 9   Other Clinical Data Fasting: no Labs: no Today's Visit #: 4 Starting Date: 02/23/24    OBESITY Ashlee Robertson is here to discuss her progress with her obesity treatment plan along with follow-up of her obesity related diagnoses.    Nutrition Plan: the Category 2 plan - 80% adherence.  Current exercise: walking and weightlifting  Interim History:  She is down another 3 pounds Eating all of the food on the plan., Protein intake is as prescribed, Water intake is adequate., Reports polyphagia, and Denies excessive cravings.   Pharmacotherapy: Ashlee Robertson is on Wegovy  1.0 mg SQ weekly Adverse side effects: None Hunger is moderately controlled.  Cravings are moderately controlled.  Assessment/Plan:   Vitamin D  Deficiency Vitamin D  is not at goal of 50.  Most recent vitamin D  level was 27.2. She is on  prescription ergocalciferol  50,000 IU weekly. Lab Results  Component Value Date   VD25OH 27.2 (L) 02/23/2024    Plan: Refill prescription vitamin D  50,000 IU weekly.   Polyphagia Ashlee Robertson endorses excessive hunger.  Medication(s): Wegovy  1.0 mg weekly Effects of medication:  moderately controlled. Cravings are moderately controlled.   Plan: Medication(s):  Increase Wegovy  1.7 SQ weekly Will increase water, protein and fiber to help assuage hunger.  Will minimize foods that have a high glucose index/load to minimize reactive hypoglycemia.    Generalized Obesity: Current BMI BMI (Calculated): 29.49   Pharmacotherapy Plan Continue and increase dose  Wegovy  1.7 SQ weekly  Ashlee Robertson is currently in the action stage of change. As such, her goal is to continue with weight loss efforts.  She has agreed to the Category 2 plan.  Exercise goals: For substantial health benefits, adults should do at least 150 minutes (2 hours and 30 minutes) a week of moderate-intensity, or 75 minutes (1 hour and 15 minutes) a week of vigorous-intensity aerobic physical activity, or an equivalent combination of moderate- and vigorous-intensity aerobic activity. Aerobic activity should be performed in episodes of at least 10 minutes, and preferably, it should be spread throughout the week.  Behavioral modification strategies: increasing lean protein intake, no meal skipping, decrease eating out, meal planning , increase water intake, better snacking choices, planning for success, increasing vegetables, and keep  healthy foods in the home.  Ashlee Robertson has agreed to follow-up with our clinic in 2 weeks.      Objective:   VITALS: Per patient if applicable, see vitals. GENERAL: Alert and in no acute distress. CARDIOPULMONARY: No increased WOB. Speaking in clear sentences.  PSYCH: Pleasant and cooperative. Speech normal rate and rhythm. Affect is appropriate. Insight and judgement are appropriate. Attention is focused, linear, and appropriate.  NEURO: Oriented as arrived to appointment on time with no prompting.   Attestation Statements:    This was prepared with the assistance of Engineer, civil (consulting).  Occasional wrong-word or sound-a-like substitutions may have occurred due to the inherent limitations of voice recognition   Ashlee Peper, DO

## 2024-04-15 ENCOUNTER — Other Ambulatory Visit (HOSPITAL_BASED_OUTPATIENT_CLINIC_OR_DEPARTMENT_OTHER): Payer: Self-pay

## 2024-04-19 ENCOUNTER — Other Ambulatory Visit (HOSPITAL_BASED_OUTPATIENT_CLINIC_OR_DEPARTMENT_OTHER): Payer: Self-pay

## 2024-04-19 ENCOUNTER — Ambulatory Visit: Admitting: Bariatrics

## 2024-04-19 ENCOUNTER — Encounter: Payer: Self-pay | Admitting: Bariatrics

## 2024-04-19 VITALS — BP 118/79 | HR 78 | Temp 97.9°F | Ht 65.5 in | Wt 180.0 lb

## 2024-04-19 DIAGNOSIS — E559 Vitamin D deficiency, unspecified: Secondary | ICD-10-CM | POA: Diagnosis not present

## 2024-04-19 DIAGNOSIS — Z6829 Body mass index (BMI) 29.0-29.9, adult: Secondary | ICD-10-CM

## 2024-04-19 DIAGNOSIS — R632 Polyphagia: Secondary | ICD-10-CM

## 2024-04-19 DIAGNOSIS — E669 Obesity, unspecified: Secondary | ICD-10-CM

## 2024-04-19 MED ORDER — VITAMIN D (ERGOCALCIFEROL) 1.25 MG (50000 UNIT) PO CAPS
50000.0000 [IU] | ORAL_CAPSULE | ORAL | 0 refills | Status: DC
  Filled 2024-04-19: qty 4, 28d supply, fill #0

## 2024-04-19 NOTE — Progress Notes (Signed)
 WEIGHT SUMMARY AND BIOMETRICS  Weight Lost Since Last Visit: 0  Weight Gained Since Last Visit: 0   Vitals Temp: 97.9 F (36.6 C) BP: 118/79 Pulse Rate: 78 SpO2: 98 %   Anthropometric Measurements Height: 5' 5.5" (1.664 m) Weight: 180 lb (81.6 kg) BMI (Calculated): 29.49 Weight at Last Visit: 180lb Weight Lost Since Last Visit: 0 Weight Gained Since Last Visit: 0 Starting Weight: 193lb Total Weight Loss (lbs): 13 lb (5.897 kg) Peak Weight: 200lb   Body Composition  Body Fat %: 38.4 % Fat Mass (lbs): 69.2 lbs Muscle Mass (lbs): 105.2 lbs Total Body Water (lbs): 68.2 lbs Visceral Fat Rating : 9   Other Clinical Data Fasting: no Labs: no Today's Visit #: 5 Starting Date: 02/23/24    OBESITY Tyrea is here to discuss her progress with her obesity treatment plan along with follow-up of her obesity related diagnoses.    Nutrition Plan: the Category 2 plan - 75% adherence.  Current exercise: walking  Interim History:  Her weight remains the same.  She has been on vacation and has not been quite as consistent in her meals.  She is home now and wants to get back on track. Eating all of the food on the plan., Protein intake is as prescribed, Is not skipping meals, and Water intake is adequate.   Pharmacotherapy: Yiselle is on Wegovy  1.7 SQ weekly Adverse side effects: None Hunger is moderately controlled.  Cravings are moderately controlled.  Assessment/Plan:   Vitamin D  Deficiency Vitamin D  is not at goal of 50.  Most recent vitamin D  level was 27.2. She is on  prescription ergocalciferol  50,000 IU weekly. Lab Results  Component Value Date   VD25OH 27.2 (L) 02/23/2024    Plan: Refill prescription vitamin D  50,000 IU weekly.  She will get some minimal sun exposure.   Polyphagia Selah endorses excessive hunger.  Medication(s): Wegovy  1 mg.   She is just now starting the Wegovy  1.7 mg.  States she was getting a little hungry the day or 2 before her injection. Effects of medication (appetite) :  moderately controlled. Cravings are moderately controlled.   Plan: Medication(s): Wegovy  1.7 SQ weekly.  Increased her Wegovy  from 1 subcutaneously to Wegovy  1.7 at her last visit and she is just starting it now.  She is doing well with the medication Will increase water, protein and fiber to help assuage hunger.  Will minimize foods that have a high glucose index/load to minimize reactive hypoglycemia.    Generalized Obesity: Current BMI BMI (Calculated): 29.49   Pharmacotherapy Plan Continue  Wegovy  1.7 SQ weekly  Ladona is currently in the action stage of change. As such, her goal is to continue with weight loss efforts.  She has agreed to the Category 2 plan.  Exercise goals: All adults should avoid inactivity. Some physical activity is better than none, and adults  who participate in any amount of physical activity gain some health benefits. She will continue her walking and some strength training.   Behavioral modification strategies: increasing lean protein intake, decreasing simple carbohydrates , no meal skipping, decrease eating out, meal planning , increase water intake, better snacking choices, planning for success, increasing vegetables, and increasing fiber rich foods.  Jossie has agreed to follow-up with our clinic in 4 weeks.   Objective:   VITALS: Per patient if applicable, see vitals. GENERAL: Alert and in no acute distress. CARDIOPULMONARY: No increased WOB. Speaking in clear sentences.  PSYCH: Pleasant and cooperative. Speech normal rate and rhythm. Affect is appropriate. Insight and judgement are appropriate. Attention is focused, linear, and appropriate.  NEURO: Oriented as arrived to appointment on time with no prompting.   Attestation Statements:   This was prepared with the assistance of Restaurant manager, fast food.  Occasional wrong-word or sound-a-like substitutions may have occurred due to the inherent limitations of voice recognition   Kirk Peper, DO

## 2024-04-27 DIAGNOSIS — Z79899 Other long term (current) drug therapy: Secondary | ICD-10-CM | POA: Diagnosis not present

## 2024-04-27 DIAGNOSIS — F900 Attention-deficit hyperactivity disorder, predominantly inattentive type: Secondary | ICD-10-CM | POA: Diagnosis not present

## 2024-04-27 DIAGNOSIS — F419 Anxiety disorder, unspecified: Secondary | ICD-10-CM | POA: Diagnosis not present

## 2024-05-18 ENCOUNTER — Other Ambulatory Visit: Payer: Self-pay

## 2024-05-18 ENCOUNTER — Other Ambulatory Visit (HOSPITAL_BASED_OUTPATIENT_CLINIC_OR_DEPARTMENT_OTHER): Payer: Self-pay

## 2024-05-18 ENCOUNTER — Ambulatory Visit (INDEPENDENT_AMBULATORY_CARE_PROVIDER_SITE_OTHER): Admitting: Bariatrics

## 2024-05-18 ENCOUNTER — Encounter: Payer: Self-pay | Admitting: Bariatrics

## 2024-05-18 VITALS — BP 105/71 | HR 74 | Temp 97.4°F | Ht 65.5 in | Wt 175.0 lb

## 2024-05-18 DIAGNOSIS — E559 Vitamin D deficiency, unspecified: Secondary | ICD-10-CM

## 2024-05-18 DIAGNOSIS — R632 Polyphagia: Secondary | ICD-10-CM | POA: Diagnosis not present

## 2024-05-18 DIAGNOSIS — Z6828 Body mass index (BMI) 28.0-28.9, adult: Secondary | ICD-10-CM

## 2024-05-18 DIAGNOSIS — E669 Obesity, unspecified: Secondary | ICD-10-CM | POA: Diagnosis not present

## 2024-05-18 MED ORDER — VITAMIN D (ERGOCALCIFEROL) 1.25 MG (50000 UNIT) PO CAPS
50000.0000 [IU] | ORAL_CAPSULE | ORAL | 0 refills | Status: DC
  Filled 2024-05-18: qty 4, 28d supply, fill #0

## 2024-05-18 MED ORDER — WEGOVY 1.7 MG/0.75ML ~~LOC~~ SOAJ
1.7000 mg | SUBCUTANEOUS | 0 refills | Status: DC
Start: 2024-05-18 — End: 2024-06-15
  Filled 2024-05-18: qty 3, 28d supply, fill #0

## 2024-05-18 NOTE — Progress Notes (Signed)
 WEIGHT SUMMARY AND BIOMETRICS  Weight Lost Since Last Visit: 5lb  Weight Gained Since Last Visit: 0   Vitals Temp: (!) 97.4 F (36.3 C) BP: 105/71 Pulse Rate: 74 SpO2: 98 %   Anthropometric Measurements Height: 5' 5.5 (1.664 m) Weight: 175 lb (79.4 kg) BMI (Calculated): 28.67 Weight at Last Visit: 180lb Weight Lost Since Last Visit: 5lb Weight Gained Since Last Visit: 0 Starting Weight: 193lb Total Weight Loss (lbs): 18 lb (8.165 kg) Peak Weight: 200lb   Body Composition  Body Fat %: 38 % Fat Mass (lbs): 66.6 lbs Muscle Mass (lbs): 103.2 lbs Total Body Water (lbs): 67.4 lbs Visceral Fat Rating : 9   Other Clinical Data Fasting: no Labs: no Today's Visit #: 6 Starting Date: 02/23/24    OBESITY Elliett is here to discuss her progress with her obesity treatment plan along with follow-up of her obesity related diagnoses.    Nutrition Plan: the Category 2 plan - 60% adherence.  Current exercise: walking  Interim History:  She is down 5 lbs since her last visit.  Eating all of the food on the plan., Protein intake is as prescribed, Is skipping meals, Water intake is adequate., and Denies polyphagia   Pharmacotherapy: Sherissa is on Wegovy  1.7 SQ weekly Adverse side effects: None Hunger is moderately controlled.  Cravings are moderately controlled.  Assessment/Plan:   Polyphagia Braylee endorses excessive hunger.  Medication(s): Wegovy  1.7 mg  Effects of medication:  moderately controlled. Cravings are moderately controlled.   Plan: Medication(s): Wegovy  1.7 SQ weekly Will increase water, protein and fiber to help assuage hunger.  Will minimize foods that have a high glucose index/load to minimize reactive hypoglycemia.    Vitamin D  Deficiency Vitamin D  is not at goal of 50.  Most recent vitamin D  level was 27.2. She is on  prescription  ergocalciferol  50,000 IU weekly. Lab Results  Component Value Date   VD25OH 27.2 (L) 02/23/2024    Plan: Refill prescription vitamin D  50,000 IU weekly.     Generalized Obesity: Current BMI BMI (Calculated): 28.67   Pharmacotherapy Plan Continue and refill  Wegovy  1.7 SQ weekly  Shondra is currently in the action stage of change. As such, her goal is to continue with weight loss efforts.  She has agreed to the Category 2 plan.  Exercise goals: For substantial health benefits, adults should do at least 150 minutes (2 hours and 30 minutes) a week of moderate-intensity, or 75 minutes (1 hour and 15 minutes) a week of vigorous-intensity aerobic physical activity, or an equivalent combination of moderate- and vigorous-intensity aerobic activity. Aerobic activity should be performed in episodes of at least 10 minutes, and preferably, it should be spread throughout the week. She is walking most days.   Behavioral modification strategies: no meal skipping, meal planning , increase water intake, better snacking choices, planning for success, avoiding temptations, keep  healthy foods in the home, weigh protein portions, and mindful eating.  Nasya has agreed to follow-up with our clinic in 4 weeks.    Objective:   VITALS: Per patient if applicable, see vitals. GENERAL: Alert and in no acute distress. CARDIOPULMONARY: No increased WOB. Speaking in clear sentences.  PSYCH: Pleasant and cooperative. Speech normal rate and rhythm. Affect is appropriate. Insight and judgement are appropriate. Attention is focused, linear, and appropriate.  NEURO: Oriented as arrived to appointment on time with no prompting.   Attestation Statements:   This was prepared with the assistance of Engineer, civil (consulting).  Occasional wrong-word or sound-a-like substitutions may have occurred due to the inherent limitations of voice recognition   Kirk Peper, DO

## 2024-06-15 ENCOUNTER — Encounter: Payer: Self-pay | Admitting: Bariatrics

## 2024-06-15 ENCOUNTER — Other Ambulatory Visit (HOSPITAL_BASED_OUTPATIENT_CLINIC_OR_DEPARTMENT_OTHER): Payer: Self-pay

## 2024-06-15 ENCOUNTER — Other Ambulatory Visit: Payer: Self-pay | Admitting: Bariatrics

## 2024-06-15 ENCOUNTER — Ambulatory Visit (INDEPENDENT_AMBULATORY_CARE_PROVIDER_SITE_OTHER): Admitting: Bariatrics

## 2024-06-15 VITALS — BP 101/68 | HR 75 | Temp 97.6°F | Ht 65.5 in | Wt 174.0 lb

## 2024-06-15 DIAGNOSIS — E559 Vitamin D deficiency, unspecified: Secondary | ICD-10-CM | POA: Diagnosis not present

## 2024-06-15 DIAGNOSIS — Z6828 Body mass index (BMI) 28.0-28.9, adult: Secondary | ICD-10-CM | POA: Diagnosis not present

## 2024-06-15 DIAGNOSIS — R632 Polyphagia: Secondary | ICD-10-CM

## 2024-06-15 DIAGNOSIS — E669 Obesity, unspecified: Secondary | ICD-10-CM | POA: Diagnosis not present

## 2024-06-15 MED ORDER — WEGOVY 2.4 MG/0.75ML ~~LOC~~ SOAJ
2.4000 mg | SUBCUTANEOUS | 0 refills | Status: DC
  Filled 2024-06-15: qty 3, 28d supply, fill #0

## 2024-06-15 MED ORDER — VITAMIN D (ERGOCALCIFEROL) 1.25 MG (50000 UNIT) PO CAPS
50000.0000 [IU] | ORAL_CAPSULE | ORAL | 0 refills | Status: DC
  Filled 2024-06-15: qty 4, 28d supply, fill #0

## 2024-06-15 NOTE — Progress Notes (Signed)
 WEIGHT SUMMARY AND BIOMETRICS  Weight Lost Since Last Visit: 1lb  Weight Gained Since Last Visit: 0   Vitals Temp: 97.6 F (36.4 C) BP: 101/68 Pulse Rate: 75 SpO2: 98 %   Anthropometric Measurements Height: 5' 5.5 (1.664 m) Weight: 174 lb (78.9 kg) BMI (Calculated): 28.5 Weight at Last Visit: 175lb Weight Lost Since Last Visit: 1lb Weight Gained Since Last Visit: 0 Starting Weight: 193lb Total Weight Loss (lbs): 19 lb (8.618 kg) Peak Weight: 200lb   Body Composition  Body Fat %: 37.1 % Fat Mass (lbs): 64.8 lbs Muscle Mass (lbs): 104 lbs Total Body Water (lbs): 67 lbs Visceral Fat Rating : 9   Other Clinical Data Fasting: no Labs: no Today's Visit #: 7 Starting Date: 02/23/24    OBESITY Ashlee Robertson is here to discuss her progress with her obesity treatment plan along with follow-up of her obesity related diagnoses.    Nutrition Plan: the Category 2 plan - 75% adherence.  Current exercise: walking  Interim History:  She is down 1 lb since her last visit.  Eating all of the food on the plan., Protein intake is as prescribed, Is not skipping meals, and Water intake is adequate.   Pharmacotherapy: Ashlee Robertson is on Wegovy  1.7 SQ weekly Adverse side effects: None Hunger is moderately controlled.  Cravings are moderately controlled.  Assessment/Plan:   Polyphagia Ashlee Robertson endorses excessive hunger.  Medication(s): Wegovy  1.7 mg  Effects of medication:  moderately controlled. Cravings are moderately controlled.   Plan: Medication(s): Wegovy  2.4 mg SQ weekly Will increase water, protein and fiber to help assuage hunger.  Will minimize foods that have a high glucose index/load to minimize reactive hypoglycemia.   Vitamin D  Deficiency Vitamin D  is not at goal of 50.  Most recent vitamin D  level was 27.2. She is on  prescription ergocalciferol  50,000 IU  weekly. Lab Results  Component Value Date   VD25OH 27.2 (L) 02/23/2024    Plan: Continue prescription vitamin D  50,000 IU weekly.    Generalized Obesity: Current BMI BMI (Calculated): 28.5   Pharmacotherapy Plan Continue and increase dose  Wegovy  2.4 mg SQ weekly  Ashlee Robertson is currently in the action stage of change. As such, her goal is to continue with weight loss efforts.  She has agreed to the Category 2 plan.  Exercise goals: For substantial health benefits, adults should do at least 150 minutes (2 hours and 30 minutes) a week of moderate-intensity, or 75 minutes (1 hour and 15 minutes) a week of vigorous-intensity aerobic physical activity, or an equivalent combination of moderate- and vigorous-intensity aerobic activity. Aerobic activity should be performed in episodes of at least 10 minutes, and preferably, it should be spread throughout the week.  Behavioral modification strategies: increasing lean protein intake, no meal skipping, decrease eating out, increase water intake, better snacking choices, planning for success, increasing vegetables, increasing fiber rich foods,  avoiding temptations, keep healthy foods in the home, work on smaller portions, pack lunch for work, and mindful eating.  Ashlee Robertson has agreed to follow-up with our clinic in 4 weeks for a visit and fasting labs.    Medications Discontinued During This Encounter  Medication Reason   lisdexamfetamine (VYVANSE ) 40 MG capsule Duplicate      Objective:   VITALS: Per patient if applicable, see vitals. GENERAL: Alert and in no acute distress. CARDIOPULMONARY: No increased WOB. Speaking in clear sentences.  PSYCH: Pleasant and cooperative. Speech normal rate and rhythm. Affect is appropriate. Insight and judgement are appropriate. Attention is focused, linear, and appropriate.  NEURO: Oriented as arrived to appointment on time with no prompting.   Attestation Statements:   This was prepared with the assistance of  Engineer, civil (consulting).  Occasional wrong-word or sound-a-like substitutions may have occurred due to the inherent limitations of voice recognition.   Clayborne Daring, DO

## 2024-06-16 ENCOUNTER — Other Ambulatory Visit (HOSPITAL_BASED_OUTPATIENT_CLINIC_OR_DEPARTMENT_OTHER): Payer: Self-pay

## 2024-07-13 ENCOUNTER — Encounter: Payer: Self-pay | Admitting: Bariatrics

## 2024-07-13 ENCOUNTER — Other Ambulatory Visit (HOSPITAL_BASED_OUTPATIENT_CLINIC_OR_DEPARTMENT_OTHER): Payer: Self-pay

## 2024-07-13 ENCOUNTER — Ambulatory Visit (INDEPENDENT_AMBULATORY_CARE_PROVIDER_SITE_OTHER): Admitting: Bariatrics

## 2024-07-13 VITALS — BP 116/77 | HR 76 | Temp 97.8°F | Ht 65.5 in | Wt 171.0 lb

## 2024-07-13 DIAGNOSIS — R7303 Prediabetes: Secondary | ICD-10-CM

## 2024-07-13 DIAGNOSIS — Z6828 Body mass index (BMI) 28.0-28.9, adult: Secondary | ICD-10-CM

## 2024-07-13 DIAGNOSIS — E7800 Pure hypercholesterolemia, unspecified: Secondary | ICD-10-CM | POA: Diagnosis not present

## 2024-07-13 DIAGNOSIS — Z0000 Encounter for general adult medical examination without abnormal findings: Secondary | ICD-10-CM

## 2024-07-13 DIAGNOSIS — E559 Vitamin D deficiency, unspecified: Secondary | ICD-10-CM

## 2024-07-13 DIAGNOSIS — R632 Polyphagia: Secondary | ICD-10-CM | POA: Diagnosis not present

## 2024-07-13 DIAGNOSIS — E669 Obesity, unspecified: Secondary | ICD-10-CM

## 2024-07-13 MED ORDER — WEGOVY 2.4 MG/0.75ML ~~LOC~~ SOAJ
2.4000 mg | SUBCUTANEOUS | 0 refills | Status: DC
  Filled 2024-07-13: qty 3, 28d supply, fill #0

## 2024-07-13 MED ORDER — VITAMIN D (ERGOCALCIFEROL) 1.25 MG (50000 UNIT) PO CAPS
50000.0000 [IU] | ORAL_CAPSULE | ORAL | 0 refills | Status: DC
  Filled 2024-07-13: qty 4, 28d supply, fill #0
  Filled 2024-08-05: qty 4, 28d supply, fill #1

## 2024-07-13 NOTE — Progress Notes (Signed)
 WEIGHT SUMMARY AND BIOMETRICS  Weight Lost Since Last Visit: 3lb  Weight Gained Since Last Visit: 0   Vitals Temp: 97.8 F (36.6 C) BP: 116/77 Pulse Rate: 76 SpO2: 98 %   Anthropometric Measurements Height: 5' 5.5 (1.664 m) Weight: 171 lb (77.6 kg) BMI (Calculated): 28.01 Weight at Last Visit: 174lb Weight Lost Since Last Visit: 3lb Weight Gained Since Last Visit: 0 Starting Weight: 193lb Total Weight Loss (lbs): 22 lb (9.979 kg) Peak Weight: 200lb   Body Composition  Body Fat %: 36.8 % Fat Mass (lbs): 63 lbs Muscle Mass (lbs): 102.6 lbs Total Body Water (lbs): 66 lbs Visceral Fat Rating : 8   Other Clinical Data Fasting: yes Labs: yes Today's Visit #: 8 Starting Date: 02/23/24    OBESITY Ashlee Robertson is here to discuss her progress with her obesity treatment plan along with follow-up of her obesity related diagnoses.    Nutrition Plan: the Category 2 plan - 80% adherence.  Current exercise: walking  Interim History:  She is down 3 lbs since her last visit. She has been active.  Eating all of the food on the plan., Protein intake is as prescribed, Is skipping meals, and Water intake is adequate.   Pharmacotherapy: Ashlee Robertson is on Wegovy  2.4 mg SQ weekly Adverse side effects: None, occasional nausea.  Hunger is moderately controlled.  Cravings are moderately controlled.  Assessment/Plan:   Vitamin D  Deficiency Vitamin D  is not at goal of 50.  Most recent vitamin D  level was 27.2. She is on  prescription ergocalciferol  50,000 IU weekly. Lab Results  Component Value Date   VD25OH 27.2 (L) 02/23/2024    Plan: Refill prescription vitamin D  50,000 IU weekly.   Polyphagia Ashlee Robertson endorses excessive hunger.  Medication(s): Wegovy  Effects of medication (appetite):  moderately controlled. Cravings are moderately controlled.   Plan: Medication(s):  Wegovy  2.4 mg SQ weekly Will increase water, protein and fiber to help assuage hunger.  Will minimize foods that have a high glucose index/load to minimize reactive hypoglycemia.   Prediabetes Last A1c was 5.7  Medication(s): Wegovy  2.4 mg SQ weekly Lab Results  Component Value Date   HGBA1C 5.7 (H) 02/23/2024   Lab Results  Component Value Date   INSULIN  10.7 02/23/2024    Plan:  Will minimize all refined carbohydrates both sweets and starches.  Will work on the plan and exercise.  Continue both aerobic and resistance training and will increase her weight training.  Will keep protein, water, and fiber intake high.  Increase Polyunsaturated and Monounsaturated fats to increase satiety and encourage weight loss.  Aim for 7 to 9 hours of sleep nightly.  Will come continue to prepare more meals at home. Continue and refill Wegovy  2.4 mg SQ weekly    Elevated cholesterol:  Her previous cholesterol was slightly elevated.  She is not on any cholesterol medications.  Plan: Will  check a lipid panel.  Healthcare maintenance:   Obesity  Plan: Will do the following labs: CMP, lipids, A1c, insulin , and vitamin D .     Generalized Obesity: Current BMI BMI (Calculated): 28.01   Pharmacotherapy Plan Continue and refill  Wegovy  2.4 mg SQ weekly  Ashlee Robertson is currently in the action stage of change. As such, her goal is to continue with weight loss efforts.  She has agreed to the Category 2 plan.  Exercise goals: All adults should avoid inactivity. Some physical activity is better than none, and adults who participate in any amount of physical activity gain some health benefits.  She is walking and doing some resistance. She do some weight training.   Behavioral modification strategies: increasing lean protein intake, no meal skipping, meal planning , increase water intake, better snacking choices, keep healthy foods in the home, weigh protein portions, and mindful eating.  Ashlee Robertson  has agreed to follow-up with our clinic in 4 weeks.    bjective:   VITALS: Per patient if applicable, see vitals. GENERAL: Alert and in no acute distress. CARDIOPULMONARY: No increased WOB. Speaking in clear sentences.  PSYCH: Pleasant and cooperative. Speech normal rate and rhythm. Affect is appropriate. Insight and judgement are appropriate. Attention is focused, linear, and appropriate.  NEURO: Oriented as arrived to appointment on time with no prompting.   Attestation Statements:   This was prepared with the assistance of Engineer, civil (consulting).  Occasional wrong-word or sound-a-like substitutions may have occurred due to the inherent limitations of voice recognition   Clayborne Daring, DO

## 2024-07-14 LAB — COMPREHENSIVE METABOLIC PANEL WITH GFR
ALT: 16 IU/L (ref 0–32)
AST: 18 IU/L (ref 0–40)
Albumin: 4.3 g/dL (ref 3.8–4.9)
Alkaline Phosphatase: 79 IU/L (ref 44–121)
BUN/Creatinine Ratio: 15 (ref 9–23)
BUN: 9 mg/dL (ref 6–24)
Bilirubin Total: 0.4 mg/dL (ref 0.0–1.2)
CO2: 23 mmol/L (ref 20–29)
Calcium: 9.3 mg/dL (ref 8.7–10.2)
Chloride: 104 mmol/L (ref 96–106)
Creatinine, Ser: 0.6 mg/dL (ref 0.57–1.00)
Globulin, Total: 2.7 g/dL (ref 1.5–4.5)
Glucose: 77 mg/dL (ref 70–99)
Potassium: 4.6 mmol/L (ref 3.5–5.2)
Sodium: 140 mmol/L (ref 134–144)
Total Protein: 7 g/dL (ref 6.0–8.5)
eGFR: 106 mL/min/1.73 (ref >59)

## 2024-07-14 LAB — LIPID PANEL WITH LDL/HDL RATIO
Cholesterol, Total: 250 mg/dL — ABNORMAL HIGH (ref 100–199)
HDL: 67 mg/dL (ref >39)
LDL Chol Calc (NIH): 168 mg/dL — ABNORMAL HIGH (ref 0–99)
LDL/HDL Ratio: 2.5 ratio (ref 0.0–3.2)
Triglycerides: 86 mg/dL (ref 0–149)
VLDL Cholesterol Cal: 15 mg/dL (ref 5–40)

## 2024-07-14 LAB — HEMOGLOBIN A1C
Est. average glucose Bld gHb Est-mCnc: 108 mg/dL
Hgb A1c MFr Bld: 5.4 % (ref 4.8–5.6)

## 2024-07-14 LAB — INSULIN, RANDOM: INSULIN: 4.2 u[IU]/mL (ref 2.6–24.9)

## 2024-07-14 LAB — VITAMIN D 25 HYDROXY (VIT D DEFICIENCY, FRACTURES): Vit D, 25-Hydroxy: 42.5 ng/mL (ref 30.0–100.0)

## 2024-07-15 ENCOUNTER — Encounter: Payer: Self-pay | Admitting: Bariatrics

## 2024-08-05 ENCOUNTER — Other Ambulatory Visit: Payer: Self-pay | Admitting: Bariatrics

## 2024-08-05 ENCOUNTER — Other Ambulatory Visit (HOSPITAL_BASED_OUTPATIENT_CLINIC_OR_DEPARTMENT_OTHER): Payer: Self-pay

## 2024-08-05 DIAGNOSIS — E669 Obesity, unspecified: Secondary | ICD-10-CM

## 2024-08-05 DIAGNOSIS — Z0000 Encounter for general adult medical examination without abnormal findings: Secondary | ICD-10-CM

## 2024-08-05 DIAGNOSIS — E559 Vitamin D deficiency, unspecified: Secondary | ICD-10-CM

## 2024-08-05 MED ORDER — LISDEXAMFETAMINE DIMESYLATE 40 MG PO CAPS
40.0000 mg | ORAL_CAPSULE | Freq: Every day | ORAL | 0 refills | Status: DC
  Filled 2024-08-05: qty 30, 30d supply, fill #0

## 2024-08-06 ENCOUNTER — Other Ambulatory Visit (HOSPITAL_BASED_OUTPATIENT_CLINIC_OR_DEPARTMENT_OTHER): Payer: Self-pay

## 2024-08-06 ENCOUNTER — Encounter (HOSPITAL_BASED_OUTPATIENT_CLINIC_OR_DEPARTMENT_OTHER): Payer: Self-pay

## 2024-08-07 ENCOUNTER — Encounter: Payer: Self-pay | Admitting: Bariatrics

## 2024-08-07 DIAGNOSIS — Z0000 Encounter for general adult medical examination without abnormal findings: Secondary | ICD-10-CM

## 2024-08-07 DIAGNOSIS — E559 Vitamin D deficiency, unspecified: Secondary | ICD-10-CM

## 2024-08-07 DIAGNOSIS — E669 Obesity, unspecified: Secondary | ICD-10-CM

## 2024-08-09 ENCOUNTER — Other Ambulatory Visit (HOSPITAL_BASED_OUTPATIENT_CLINIC_OR_DEPARTMENT_OTHER): Payer: Self-pay

## 2024-08-09 MED ORDER — VITAMIN D (ERGOCALCIFEROL) 1.25 MG (50000 UNIT) PO CAPS
50000.0000 [IU] | ORAL_CAPSULE | ORAL | 0 refills | Status: DC
Start: 2024-08-09 — End: 2024-08-31
  Filled 2024-08-09: qty 4, 28d supply, fill #0

## 2024-08-09 MED ORDER — WEGOVY 2.4 MG/0.75ML ~~LOC~~ SOAJ
2.4000 mg | SUBCUTANEOUS | 0 refills | Status: DC
  Filled 2024-08-09: qty 3, 28d supply, fill #0

## 2024-08-10 ENCOUNTER — Other Ambulatory Visit (HOSPITAL_BASED_OUTPATIENT_CLINIC_OR_DEPARTMENT_OTHER): Payer: Self-pay

## 2024-08-11 ENCOUNTER — Other Ambulatory Visit (HOSPITAL_BASED_OUTPATIENT_CLINIC_OR_DEPARTMENT_OTHER): Payer: Self-pay

## 2024-08-13 ENCOUNTER — Encounter: Payer: Self-pay | Admitting: Nurse Practitioner

## 2024-08-13 ENCOUNTER — Other Ambulatory Visit (HOSPITAL_BASED_OUTPATIENT_CLINIC_OR_DEPARTMENT_OTHER): Payer: Self-pay

## 2024-08-16 ENCOUNTER — Telehealth: Payer: Self-pay

## 2024-08-16 NOTE — Telephone Encounter (Signed)
 PA submitted through [employer] for Wegovy . Awaiting insurance determination. Key: [id number]

## 2024-08-17 NOTE — Telephone Encounter (Signed)
 Received fax from [facility] that Wegovy  has been approved through 08/16/25.

## 2024-08-19 ENCOUNTER — Ambulatory Visit: Admitting: Bariatrics

## 2024-08-24 ENCOUNTER — Other Ambulatory Visit (HOSPITAL_BASED_OUTPATIENT_CLINIC_OR_DEPARTMENT_OTHER): Payer: Self-pay

## 2024-08-31 ENCOUNTER — Ambulatory Visit (INDEPENDENT_AMBULATORY_CARE_PROVIDER_SITE_OTHER): Admitting: Bariatrics

## 2024-08-31 ENCOUNTER — Encounter: Payer: Self-pay | Admitting: Bariatrics

## 2024-08-31 ENCOUNTER — Other Ambulatory Visit (HOSPITAL_BASED_OUTPATIENT_CLINIC_OR_DEPARTMENT_OTHER): Payer: Self-pay

## 2024-08-31 VITALS — BP 114/73 | HR 79 | Temp 97.8°F | Ht 65.5 in | Wt 164.0 lb

## 2024-08-31 DIAGNOSIS — R632 Polyphagia: Secondary | ICD-10-CM | POA: Diagnosis not present

## 2024-08-31 DIAGNOSIS — E669 Obesity, unspecified: Secondary | ICD-10-CM

## 2024-08-31 DIAGNOSIS — Z6826 Body mass index (BMI) 26.0-26.9, adult: Secondary | ICD-10-CM

## 2024-08-31 DIAGNOSIS — E559 Vitamin D deficiency, unspecified: Secondary | ICD-10-CM | POA: Diagnosis not present

## 2024-08-31 MED ORDER — VITAMIN D (ERGOCALCIFEROL) 1.25 MG (50000 UNIT) PO CAPS
50000.0000 [IU] | ORAL_CAPSULE | ORAL | 0 refills | Status: DC
  Filled 2024-08-31: qty 4, 28d supply, fill #0

## 2024-08-31 MED ORDER — WEGOVY 2.4 MG/0.75ML ~~LOC~~ SOAJ
2.4000 mg | SUBCUTANEOUS | 0 refills | Status: DC
  Filled 2024-08-31 – 2024-09-25 (×5): qty 3, 28d supply, fill #0

## 2024-08-31 NOTE — Progress Notes (Signed)
 WEIGHT SUMMARY AND BIOMETRICS  Weight Lost Since Last Visit: 7lb  Weight Gained Since Last Visit: 0   Vitals Temp: 97.8 F (36.6 C) BP: 114/73 Pulse Rate: 79 SpO2: 99 %   Anthropometric Measurements Height: 5' 5.5 (1.664 m) Weight: 164 lb (74.4 kg) BMI (Calculated): 26.87 Weight at Last Visit: 171lb Weight Lost Since Last Visit: 7lb Weight Gained Since Last Visit: 0 Starting Weight: 193lb Total Weight Loss (lbs): 29 lb (13.2 kg) Peak Weight: 200lb   Body Composition  Body Fat %: 37 % Fat Mass (lbs): 60.8 lbs Muscle Mass (lbs): 98.4 lbs Total Body Water (lbs): 65.8 lbs Visceral Fat Rating : 8   Other Clinical Data Fasting: no Labs: no Today's Visit #: 9 Starting Date: 02/23/24    OBESITY Emilia is here to discuss her progress with her obesity treatment plan along with follow-up of her obesity related diagnoses.    Nutrition Plan: the Category 2 plan - 75% adherence.  Current exercise: walking and weightlifting  Interim History:  She is down 7 lbs since her last visit. She had been off the medication for 2 weeks as she has been traveling.  Eating all of the food on the plan., Protein intake is as prescribed, Journaling consistently., and Water intake is adequate.   Pharmacotherapy: Cerissa is on Wegovy  2.4 mg SQ weekly Adverse side effects: None Hunger is well controlled.  Cravings are well controlled.  Assessment/Plan:   Polyphagia Ireoluwa endorses excessive hunger.  Medication(s): Wegovy  2.4  Effects of medication (appetite) :  well controlled. Cravings are well controlled.   Plan: Medication(s): Wegovy  2.4 mg SQ weekly Will increase water, protein and fiber to help assuage hunger.  Will minimize foods that have a high glucose index/load to minimize reactive hypoglycemia.  Will adhere closely to the plan approximately 85 to 95%. She  will continue her walking on a regular basis.  Vitamin D  Deficiency Vitamin D  is not at goal of 50.  Most recent vitamin D  level was 42.5. She is on  prescription ergocalciferol  50,000 IU weekly. Lab Results  Component Value Date   VD25OH 42.5 07/13/2024   VD25OH 27.2 (L) 02/23/2024    Plan: Refill prescription vitamin D  50,000 IU weekly.     Generalized Obesity: Current BMI BMI (Calculated): 26.87   Pharmacotherapy Plan Continue and refill  Wegovy  2.4 mg SQ weekly  Shauntel is currently in the action stage of change. As such, her goal is to continue with weight loss efforts.  She has agreed to the Category 2 plan.  Exercise goals: All adults should avoid inactivity. Some physical activity is better than none, and adults who participate in any amount of physical activity gain some health benefits. She has gotten back into the gym and doing weights.   Behavioral modification strategies: increasing lean protein intake, decreasing simple carbohydrates , no meal skipping, meal planning ,  increase water intake, better snacking choices, planning for success, avoiding temptations, weigh protein portions, measure portion sizes, pack lunch for work, and mindful eating.  Arlean has agreed to follow-up with our clinic in 4 weeks.   Objective:   VITALS: Per patient if applicable, see vitals. GENERAL: Alert and in no acute distress. CARDIOPULMONARY: No increased WOB. Speaking in clear sentences.  PSYCH: Pleasant and cooperative. Speech normal rate and rhythm. Affect is appropriate. Insight and judgement are appropriate. Attention is focused, linear, and appropriate.  NEURO: Oriented as arrived to appointment on time with no prompting.   Attestation Statements:   This was prepared with the assistance of Engineer, civil (consulting).  Occasional wrong-word or sound-a-like substitutions may have occurred due to the inherent limitations of voice recognition    Clayborne Daring, DO

## 2024-09-07 ENCOUNTER — Other Ambulatory Visit (HOSPITAL_BASED_OUTPATIENT_CLINIC_OR_DEPARTMENT_OTHER): Payer: Self-pay

## 2024-09-14 ENCOUNTER — Other Ambulatory Visit (HOSPITAL_BASED_OUTPATIENT_CLINIC_OR_DEPARTMENT_OTHER): Payer: Self-pay

## 2024-09-24 ENCOUNTER — Other Ambulatory Visit (HOSPITAL_BASED_OUTPATIENT_CLINIC_OR_DEPARTMENT_OTHER): Payer: Self-pay

## 2024-09-25 ENCOUNTER — Other Ambulatory Visit (HOSPITAL_BASED_OUTPATIENT_CLINIC_OR_DEPARTMENT_OTHER): Payer: Self-pay

## 2024-09-29 ENCOUNTER — Ambulatory Visit: Admitting: Bariatrics

## 2024-09-30 ENCOUNTER — Encounter: Payer: Self-pay | Admitting: Bariatrics

## 2024-09-30 ENCOUNTER — Other Ambulatory Visit (HOSPITAL_BASED_OUTPATIENT_CLINIC_OR_DEPARTMENT_OTHER): Payer: Self-pay

## 2024-09-30 ENCOUNTER — Ambulatory Visit: Admitting: Bariatrics

## 2024-09-30 VITALS — BP 117/74 | HR 74 | Temp 97.6°F | Ht 65.5 in | Wt 161.0 lb

## 2024-09-30 DIAGNOSIS — Z6826 Body mass index (BMI) 26.0-26.9, adult: Secondary | ICD-10-CM

## 2024-09-30 DIAGNOSIS — R632 Polyphagia: Secondary | ICD-10-CM

## 2024-09-30 DIAGNOSIS — E669 Obesity, unspecified: Secondary | ICD-10-CM

## 2024-09-30 DIAGNOSIS — E559 Vitamin D deficiency, unspecified: Secondary | ICD-10-CM

## 2024-09-30 MED ORDER — WEGOVY 2.4 MG/0.75ML ~~LOC~~ SOAJ
2.4000 mg | SUBCUTANEOUS | 0 refills | Status: DC
  Filled 2024-09-30 – 2024-10-18 (×2): qty 3, 28d supply, fill #0

## 2024-09-30 MED ORDER — VITAMIN D (ERGOCALCIFEROL) 1.25 MG (50000 UNIT) PO CAPS
50000.0000 [IU] | ORAL_CAPSULE | ORAL | 0 refills | Status: DC
  Filled 2024-09-30: qty 4, 28d supply, fill #0

## 2024-09-30 NOTE — Progress Notes (Signed)
 WEIGHT SUMMARY AND BIOMETRICS  Weight Lost Since Last Visit: 3lb  Weight Gained Since Last Visit: 0   Vitals Temp: 97.6 F (36.4 C) BP: 117/74 Pulse Rate: 74 SpO2: 99 %   Anthropometric Measurements Height: 5' 5.5 (1.664 m) Weight: 161 lb (73 kg) BMI (Calculated): 26.37 Weight at Last Visit: 164lb Weight Lost Since Last Visit: 3lb Weight Gained Since Last Visit: 0 Starting Weight: 193lb Total Weight Loss (lbs): 32 lb (14.5 kg) Peak Weight: 200lb   Body Composition  Body Fat %: 36.5 % Fat Mass (lbs): 59 lbs Muscle Mass (lbs): 97.4 lbs Total Body Water (lbs): 65.4 lbs Visceral Fat Rating : 8   Other Clinical Data Fasting: no Labs: no Today's Visit #: 10 Starting Date: 02/23/24    OBESITY Ashlee Robertson is here to discuss her progress with her obesity treatment plan along with follow-up of her obesity related diagnoses.    Nutrition Plan: the Category 2 plan - 65% adherence.  Current exercise: walking  Interim History:  She is down 3 lbs since her last visit.  Eating all of the food on the plan., Protein intake is as prescribed, and Water intake is adequate.   Pharmacotherapy: Dezzie is on Wegovy  2.4 mg SQ weekly Adverse side effects: None Hunger is moderately controlled.  Cravings are moderately controlled.  Assessment/Plan:   Vitamin D  Deficiency Vitamin D  is not at goal of 50.  Most recent vitamin D  level was 42.5. She is on  prescription ergocalciferol  50,000 IU weekly. Lab Results  Component Value Date   VD25OH 42.5 07/13/2024   VD25OH 27.2 (L) 02/23/2024    Plan: Continue prescription vitamin D  50,000 IU weekly.   Polyphagia Doshie endorses excessive hunger.  Medication(s): Wegovy   Effects of medication:  moderately controlled. Cravings are moderately controlled.   Plan: Medication(s): Wegovy  2.4 mg SQ weekly Will increase water,  protein and fiber to help assuage hunger.  Will minimize foods that have a high glucose index/load to minimize reactive hypoglycemia.  Discussed healthy recipes.     Morbid Obesity: Current BMI BMI (Calculated): 26.37   Pharmacotherapy Plan Continue and refill  Wegovy  2.4 mg SQ weekly  Reiko is currently in the action stage of change. As such, her goal is to continue with weight loss efforts.  She has agreed to the Category 2 plan.  Exercise goals: All adults should avoid inactivity. Some physical activity is better than none, and adults who participate in any amount of physical activity gain some health benefits. She will continue walking and will add in some weights.   Behavioral modification strategies: increasing lean protein intake, no meal skipping, meal planning , increase water intake, better snacking choices, planning for success, increasing vegetables, avoiding temptations, and keep healthy foods in the home.  Terisha has agreed to follow-up with our clinic in 4 weeks.    Objective:   VITALS: Per  patient if applicable, see vitals. GENERAL: Alert and in no acute distress. CARDIOPULMONARY: No increased WOB. Speaking in clear sentences.  PSYCH: Pleasant and cooperative. Speech normal rate and rhythm. Affect is appropriate. Insight and judgement are appropriate. Attention is focused, linear, and appropriate.  NEURO: Oriented as arrived to appointment on time with no prompting.   Attestation Statements:   This was prepared with the assistance of Engineer, Civil (consulting).  Occasional wrong-word or sound-a-like substitutions may have occurred due to the inherent limitations of voice recognition    Clayborne Daring, DO

## 2024-10-05 ENCOUNTER — Other Ambulatory Visit (HOSPITAL_BASED_OUTPATIENT_CLINIC_OR_DEPARTMENT_OTHER): Payer: Self-pay

## 2024-10-05 MED ORDER — LISDEXAMFETAMINE DIMESYLATE 40 MG PO CAPS
40.0000 mg | ORAL_CAPSULE | Freq: Every day | ORAL | 0 refills | Status: AC
  Filled 2024-10-05: qty 30, 30d supply, fill #0

## 2024-10-18 ENCOUNTER — Other Ambulatory Visit (HOSPITAL_BASED_OUTPATIENT_CLINIC_OR_DEPARTMENT_OTHER): Payer: Self-pay

## 2024-10-26 ENCOUNTER — Encounter: Payer: Self-pay | Admitting: Bariatrics

## 2024-10-26 ENCOUNTER — Ambulatory Visit: Admitting: Bariatrics

## 2024-10-26 ENCOUNTER — Other Ambulatory Visit (HOSPITAL_BASED_OUTPATIENT_CLINIC_OR_DEPARTMENT_OTHER): Payer: Self-pay

## 2024-10-26 VITALS — BP 107/71 | HR 85 | Ht 65.5 in | Wt 158.0 lb

## 2024-10-26 DIAGNOSIS — E559 Vitamin D deficiency, unspecified: Secondary | ICD-10-CM | POA: Diagnosis not present

## 2024-10-26 DIAGNOSIS — R632 Polyphagia: Secondary | ICD-10-CM | POA: Diagnosis not present

## 2024-10-26 DIAGNOSIS — Z6825 Body mass index (BMI) 25.0-25.9, adult: Secondary | ICD-10-CM

## 2024-10-26 DIAGNOSIS — E669 Obesity, unspecified: Secondary | ICD-10-CM

## 2024-10-26 MED ORDER — VITAMIN D (ERGOCALCIFEROL) 1.25 MG (50000 UNIT) PO CAPS
50000.0000 [IU] | ORAL_CAPSULE | ORAL | 0 refills | Status: DC
  Filled 2024-10-26 – 2024-10-29 (×2): qty 4, 28d supply, fill #0

## 2024-10-26 MED ORDER — WEGOVY 2.4 MG/0.75ML ~~LOC~~ SOAJ
2.4000 mg | SUBCUTANEOUS | 0 refills | Status: DC
  Filled 2024-11-19: qty 3, 28d supply, fill #0

## 2024-10-26 NOTE — Progress Notes (Signed)
 WEIGHT SUMMARY AND BIOMETRICS  Weight Lost Since Last Visit: 3lb  Weight Gained Since Last Visit: 0   Vitals BP: 107/71 Pulse Rate: 85 SpO2: 100 %   Anthropometric Measurements Height: 5' 5.5 (1.664 m) Weight: 158 lb (71.7 kg) BMI (Calculated): 25.88 Weight at Last Visit: 161lb Weight Lost Since Last Visit: 3lb Weight Gained Since Last Visit: 0 Starting Weight: 193lb Total Weight Loss (lbs): 35 lb (15.9 kg) Peak Weight: 200lb   Body Composition  Body Fat %: 34.8 % Fat Mass (lbs): 55.2 lbs Muscle Mass (lbs): 98.2 lbs Total Body Water (lbs): 63.4 lbs Visceral Fat Rating : 7   Other Clinical Data Fasting: no Labs: no Today's Visit #: 11 Starting Date: 02/23/24    OBESITY Ashlee Robertson is here to discuss her progress with her obesity treatment plan along with follow-up of her obesity related diagnoses.    Nutrition Plan: the Category 2 plan - 65% adherence.  Current exercise: walking  Interim History:  She is down 3 lbs since her last visit.  Eating all of the food on the plan., Protein intake is as prescribed, Is not skipping meals, and Water intake is adequate.   Pharmacotherapy: Ashlee Robertson is on Wegovy  2.4 mg SQ weekly Adverse side effects: None Hunger is moderately controlled.  Cravings are moderately controlled.  Assessment/Plan:   Vitamin D  Deficiency Vitamin D  is not at goal of 50.  Most recent vitamin D  level was 42.5. Her energy is better.  She is on  prescription ergocalciferol  50,000 IU weekly. Lab Results  Component Value Date   VD25OH 42.5 07/13/2024   VD25OH 27.2 (L) 02/23/2024    Plan: Refill prescription vitamin D  50,000 IU weekly.   Polyphagia Ashlee Robertson endorses excessive hunger.  Medication(s): Wegovy  Effects of medication (appetite):  moderately controlled. Cravings are moderately controlled.   Plan: Medication(s): Wegovy  2.4  mg SQ weekly Will increase water, protein and fiber to help assuage hunger.  Will minimize foods that have a high glucose index/load to minimize reactive hypoglycemia.  Holiday strategies and handout given.    Generalized Obesity: Current BMI BMI (Calculated): 25.88   Pharmacotherapy Plan Continue and refill  Wegovy  2.4 mg SQ weekly  Ashlee Robertson is currently in the action stage of change. As such, her goal is to maintain weight for now.  She has agreed to the Category 2 plan.  Exercise goals: For substantial health benefits, adults should do at least 150 minutes (2 hours and 30 minutes) a week of moderate-intensity, or 75 minutes (1 hour and 15 minutes) a week of vigorous-intensity aerobic physical activity, or an equivalent combination of moderate- and vigorous-intensity aerobic activity. Aerobic activity should be performed in episodes of at least 10 minutes, and preferably, it should be spread throughout the week.  She will be walking more and will get in more weights.   Behavioral modification strategies: increasing lean protein intake,  meal planning , increase water intake, better snacking choices, planning for success, increasing vegetables, keep healthy foods in the home, measure portion sizes, and work on smaller portions.  Ashlee Robertson has agreed to follow-up with our clinic in 4 weeks.    Objective:   VITALS: Per patient if applicable, see vitals. GENERAL: Alert and in no acute distress. CARDIOPULMONARY: No increased WOB. Speaking in clear sentences.  PSYCH: Pleasant and cooperative. Speech normal rate and rhythm. Affect is appropriate. Insight and judgement are appropriate. Attention is focused, linear, and appropriate.  NEURO: Oriented as arrived to appointment on time with no prompting.   Attestation Statements:   This was prepared with the assistance of Engineer, Civil (consulting).  Occasional wrong-word or sound-a-like substitutions may have occurred due to the inherent limitations of  voice recognition   Clayborne Daring, DO

## 2024-10-29 ENCOUNTER — Other Ambulatory Visit (HOSPITAL_BASED_OUTPATIENT_CLINIC_OR_DEPARTMENT_OTHER): Payer: Self-pay

## 2024-11-02 ENCOUNTER — Other Ambulatory Visit (HOSPITAL_BASED_OUTPATIENT_CLINIC_OR_DEPARTMENT_OTHER): Payer: Self-pay

## 2024-11-02 DIAGNOSIS — F902 Attention-deficit hyperactivity disorder, combined type: Secondary | ICD-10-CM | POA: Diagnosis not present

## 2024-11-02 DIAGNOSIS — Z79899 Other long term (current) drug therapy: Secondary | ICD-10-CM | POA: Diagnosis not present

## 2024-11-02 MED ORDER — LISDEXAMFETAMINE DIMESYLATE 30 MG PO CAPS
30.0000 mg | ORAL_CAPSULE | Freq: Every day | ORAL | 0 refills | Status: AC
  Filled 2024-11-02: qty 30, 30d supply, fill #0

## 2024-11-19 ENCOUNTER — Other Ambulatory Visit (HOSPITAL_BASED_OUTPATIENT_CLINIC_OR_DEPARTMENT_OTHER): Payer: Self-pay

## 2024-11-22 ENCOUNTER — Other Ambulatory Visit (HOSPITAL_BASED_OUTPATIENT_CLINIC_OR_DEPARTMENT_OTHER): Payer: Self-pay

## 2024-11-22 ENCOUNTER — Encounter: Payer: Self-pay | Admitting: Bariatrics

## 2024-11-22 ENCOUNTER — Ambulatory Visit: Admitting: Bariatrics

## 2024-11-22 VITALS — BP 101/68 | HR 72 | Ht 65.5 in | Wt 156.0 lb

## 2024-11-22 DIAGNOSIS — R632 Polyphagia: Secondary | ICD-10-CM

## 2024-11-22 DIAGNOSIS — Z6825 Body mass index (BMI) 25.0-25.9, adult: Secondary | ICD-10-CM | POA: Diagnosis not present

## 2024-11-22 DIAGNOSIS — E669 Obesity, unspecified: Secondary | ICD-10-CM

## 2024-11-22 DIAGNOSIS — E559 Vitamin D deficiency, unspecified: Secondary | ICD-10-CM | POA: Diagnosis not present

## 2024-11-22 MED ORDER — WEGOVY 2.4 MG/0.75ML ~~LOC~~ SOAJ: 2.4000 mg | SUBCUTANEOUS | 0 refills | Status: DC

## 2024-11-22 MED ORDER — VITAMIN D (ERGOCALCIFEROL) 1.25 MG (50000 UNIT) PO CAPS
50000.0000 [IU] | ORAL_CAPSULE | ORAL | 0 refills | Status: DC
  Filled 2024-11-22: qty 4, 28d supply, fill #0

## 2024-11-22 NOTE — Progress Notes (Signed)
 "                                                                                                             WEIGHT SUMMARY AND BIOMETRICS  Weight Lost Since Last Visit: 2lb  Weight Gained Since Last Visit: 0   Vitals BP: 101/68 Pulse Rate: 72 SpO2: 100 %   Anthropometric Measurements Height: 5' 5.5 (1.664 m) Weight: 156 lb (70.8 kg) BMI (Calculated): 25.56 Weight at Last Visit: 158lb Weight Lost Since Last Visit: 2lb Weight Gained Since Last Visit: 0 Starting Weight: 193lb Total Weight Loss (lbs): 27 lb (12.2 kg) Peak Weight: 200lb   Body Composition  Body Fat %: 34.4 % Fat Mass (lbs): 54 lbs Muscle Mass (lbs): 97.6 lbs Total Body Water (lbs): 63 lbs Visceral Fat Rating : 7   Other Clinical Data Fasting: no Labs: no Today's Visit #: 12 Starting Date: 02/23/24    OBESITY Ashlee Robertson is here to discuss her progress with her obesity treatment plan along with follow-up of her obesity related diagnoses.    Nutrition Plan: the Category 2 plan - 75% adherence.  Current exercise: walking  Interim History:  She is down 2 lbs since her last visit.  Eating all of the food on the plan., Protein intake is as prescribed, Is not skipping meals, and Water intake is adequate.   Pharmacotherapy: Ashlee Robertson is on Wegovy  2.4 mg SQ weekly Adverse side effects: None Hunger is well controlled.  Cravings are moderately controlled.  Assessment/Plan:   Vitamin D  Deficiency Vitamin D  is at goal of 50.  Most recent vitamin D  level was 42.5. She is on  prescription ergocalciferol  50,000 IU weekly. Lab Results  Component Value Date   VD25OH 42.5 07/13/2024   VD25OH 27.2 (L) 02/23/2024    Plan: Refill prescription vitamin D  50,000 IU weekly.   Polyphagia Ashlee Robertson endorses excessive hunger.  Medication(s): Wegovy  Effects of medication (appetite) :  moderately controlled. Cravings are moderately controlled.   Plan: Medication(s): Wegovy  2.4 mg SQ weekly Will increase water,  protein and fiber to help assuage hunger.  Will minimize foods that have a high glucose index/load to minimize reactive hypoglycemia.    Generalized Obesity: Current BMI BMI (Calculated): 25.56   Pharmacotherapy Plan Continue and refill  Wegovy  2.4 mg SQ weekly  Ashlee Robertson is currently in the action stage of change. As such, her goal is to continue with weight loss efforts.  She has agreed to the Category 2 plan.  Exercise goals: All adults should avoid inactivity. Some physical activity is better than none, and adults who participate in any amount of physical activity gain some health benefits. She will get back to the gym.   Behavioral modification strategies: increasing lean protein intake, meal planning , increase water intake, better snacking choices, planning for success, increasing vegetables, increasing lower sugar fruits, increasing fiber rich foods, avoiding temptations, keep healthy foods in the home, increase frequency of journaling, measure portion sizes, and mindful eating.  Ashlee Robertson has agreed to follow-up with our clinic in 4 weeks.  Objective:   VITALS: Per patient if applicable, see vitals. GENERAL: Alert and in no acute distress. CARDIOPULMONARY: No increased WOB. Speaking in clear sentences.  PSYCH: Pleasant and cooperative. Speech normal rate and rhythm. Affect is appropriate. Insight and judgement are appropriate. Attention is focused, linear, and appropriate.  NEURO: Oriented as arrived to appointment on time with no prompting.   Attestation Statements:   This was prepared with the assistance of Engineer, Civil (consulting).  Occasional wrong-word or sound-a-like substitutions may have occurred due to the inherent limitations of voice recognition.   Clayborne Daring, DO    "

## 2024-11-29 ENCOUNTER — Other Ambulatory Visit (HOSPITAL_BASED_OUTPATIENT_CLINIC_OR_DEPARTMENT_OTHER): Payer: Self-pay

## 2024-12-16 ENCOUNTER — Encounter: Payer: Self-pay | Admitting: Bariatrics

## 2024-12-16 ENCOUNTER — Ambulatory Visit: Admitting: Bariatrics

## 2024-12-16 ENCOUNTER — Other Ambulatory Visit (HOSPITAL_BASED_OUTPATIENT_CLINIC_OR_DEPARTMENT_OTHER): Payer: Self-pay

## 2024-12-16 VITALS — BP 119/75 | HR 82 | Ht 65.5 in | Wt 154.0 lb

## 2024-12-16 DIAGNOSIS — E559 Vitamin D deficiency, unspecified: Secondary | ICD-10-CM | POA: Diagnosis not present

## 2024-12-16 DIAGNOSIS — E669 Obesity, unspecified: Secondary | ICD-10-CM

## 2024-12-16 DIAGNOSIS — Z6825 Body mass index (BMI) 25.0-25.9, adult: Secondary | ICD-10-CM

## 2024-12-16 DIAGNOSIS — R632 Polyphagia: Secondary | ICD-10-CM

## 2024-12-16 MED ORDER — WEGOVY 2.4 MG/0.75ML ~~LOC~~ SOAJ
2.4000 mg | SUBCUTANEOUS | 0 refills | Status: AC
  Filled 2024-12-16 – 2024-12-29 (×2): qty 3, 28d supply, fill #0

## 2024-12-16 MED ORDER — VITAMIN D (ERGOCALCIFEROL) 1.25 MG (50000 UNIT) PO CAPS
50000.0000 [IU] | ORAL_CAPSULE | ORAL | 0 refills | Status: AC
  Filled 2024-12-16: qty 5, 35d supply, fill #0
  Filled 2024-12-29: qty 4, 28d supply, fill #0

## 2024-12-16 NOTE — Progress Notes (Signed)
 "                                                                                                             WEIGHT SUMMARY AND BIOMETRICS  Weight Lost Since Last Visit: 2lb  Weight Gained Since Last Visit: 0   Vitals BP: 119/75 Pulse Rate: 82 SpO2: 99 %   Anthropometric Measurements Height: 5' 5.5 (1.664 m) Weight: 154 lb (69.9 kg) BMI (Calculated): 25.23 Weight at Last Visit: 156lb Weight Lost Since Last Visit: 2lb Weight Gained Since Last Visit: 0 Starting Weight: 193lb Total Weight Loss (lbs): 29 lb (13.2 kg) Peak Weight: 200lb   Body Composition  Body Fat %: 35.3 % Fat Mass (lbs): 54.6 lbs Muscle Mass (lbs): 95.2 lbs Total Body Water (lbs): 64.4 lbs Visceral Fat Rating : 7   Other Clinical Data Fasting: no Labs: no Today's Visit #: 13 Starting Date: 02/23/24    OBESITY Ilanna is here to discuss her progress with her obesity treatment plan along with follow-up of her obesity related diagnoses.    Nutrition Plan: the Category 2 plan - 80% adherence.  Current exercise: walking  Interim History:  She is down 2 lbs over the holiday. She is not fluctuating.  Eating all of the food on the plan., Protein intake is as prescribed, and Water intake is adequate.   Pharmacotherapy: Jolynne is on Wegovy  2.4 mg SQ weekly Adverse side effects: None Hunger is moderately controlled.  Cravings are well controlled.  Assessment/Plan:   Vitamin D  Deficiency Vitamin D  is not at goal of 50.  Most recent vitamin D  level was 42.5. She is on  prescription ergocalciferol  50,000 IU weekly. Lab Results  Component Value Date   VD25OH 42.5 07/13/2024   VD25OH 27.2 (L) 02/23/2024    Plan: Refill prescription vitamin D  50,000 IU weekly.  Will work on maintaining a good sleep schedule.   Polyphagia Gaylyn endorses excessive hunger.  Medication(s): Wegovy   Effects of medication:  moderately controlled. Cravings are moderately controlled.   Plan: Medication(s):  Wegovy  2.4 mg SQ weekly Will increase water, protein and fiber to help assuage hunger.  Will minimize foods that have a high glucose index/load to minimize reactive hypoglycemia.  Will keep her water intake high.  Will continue to walk and will add more weights.    Generalized Obesity: Current BMI BMI (Calculated): 25.23   Pharmacotherapy Plan Continue and refill  Wegovy  2.4 mg SQ weekly  Metzli is currently in the action stage of change. As such, her goal is to continue with weight loss efforts.  She has agreed to the Category 2 plan.  Exercise goals: All adults should avoid inactivity. Some physical activity is better than none, and adults who participate in any amount of physical activity gain some health benefits.  Behavioral modification strategies: increasing lean protein intake, decreasing simple carbohydrates , no meal skipping, meal planning , increase water intake, better snacking choices, planning for success, decrease snacking , and keep healthy foods in the home.  Phoebe has agreed to follow-up with our  clinic in 4 weeks.   Objective:   VITALS: Per patient if applicable, see vitals. GENERAL: Alert and in no acute distress. CARDIOPULMONARY: No increased WOB. Speaking in clear sentences.  PSYCH: Pleasant and cooperative. Speech normal rate and rhythm. Affect is appropriate. Insight and judgement are appropriate. Attention is focused, linear, and appropriate.  NEURO: Oriented as arrived to appointment on time with no prompting.   Attestation Statements:   This was prepared with the assistance of Engineer, Civil (consulting).  Occasional wrong-word or sound-a-like substitutions may have occurred due to the inherent limitations of voice recognition.   Clayborne Daring, DO    "

## 2024-12-28 ENCOUNTER — Other Ambulatory Visit (HOSPITAL_BASED_OUTPATIENT_CLINIC_OR_DEPARTMENT_OTHER): Payer: Self-pay

## 2024-12-29 ENCOUNTER — Other Ambulatory Visit (HOSPITAL_BASED_OUTPATIENT_CLINIC_OR_DEPARTMENT_OTHER): Payer: Self-pay

## 2025-01-13 ENCOUNTER — Ambulatory Visit: Admitting: Bariatrics

## 2025-03-29 ENCOUNTER — Encounter
